# Patient Record
Sex: Male | Born: 1995 | Race: Black or African American | Hispanic: No | Marital: Single | Smoking: Never smoker
Health system: Southern US, Community
[De-identification: ages and names within clinical notes are randomized; demographics above are authoritative.]

## PROBLEM LIST (undated history)

## (undated) DIAGNOSIS — Z789 Other specified health status: Secondary | ICD-10-CM

---
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
## 2015-05-05 ENCOUNTER — Emergency Department (HOSPITAL_COMMUNITY): Payer: BLUE CROSS/BLUE SHIELD

## 2015-05-05 ENCOUNTER — Encounter (HOSPITAL_COMMUNITY): Payer: Self-pay | Admitting: Emergency Medicine

## 2015-05-05 ENCOUNTER — Emergency Department (HOSPITAL_COMMUNITY)
Admission: EM | Admit: 2015-05-05 | Discharge: 2015-05-05 | Disposition: A | Discharge status: Home / Self Care | Payer: BLUE CROSS/BLUE SHIELD | Attending: Emergency Medicine | Admitting: Emergency Medicine

## 2015-05-05 DIAGNOSIS — Z87828 Personal history of other (healed) physical injury and trauma: Secondary | ICD-10-CM | POA: Diagnosis not present

## 2015-05-05 DIAGNOSIS — M79641 Pain in right hand: Secondary | ICD-10-CM | POA: Diagnosis present

## 2015-05-05 DIAGNOSIS — Z720 Tobacco use: Secondary | ICD-10-CM | POA: Diagnosis not present

## 2015-05-05 NOTE — ED Notes (Signed)
AVS explained in detail. Work note given. Follow up for orthopedics given. No other c/c. Full ROM noted with hand.

## 2015-05-05 NOTE — ED Provider Notes (Signed)
CSN: [id number]     Arrival date & time 05/05/15  1315 History  This chart was scribed for non-physician practitioner Santiago Glad, PA-C working with Doug Sou, MD by Littie Deeds, ED Scribe. This patient was seen in room WTR8/WTR8 and the patient's care was started at 2:11 PM.      Chief Complaint  Patient presents with  . Hand Injury   The history is provided by the patient. No language interpreter was used.   HPI Comments: Brandon Cortez is a 19 y.o. male who presents to the Emergency Department complaining of sudden onset right hand pain and swelling to his 3rd knuckle secondary to an injury while playing basketball 1 month ago. His hand was hit by another player, but he is unsure of the exact mechanism of injury. The pain did improve, but started hurting again when he played basketball last week. He has not tried anything for the pain. Patient denies numbness and tingling to the hand. He has not been previously seen for the injury as he thought his hand would heal on its own.   History reviewed. No pertinent past medical history. History reviewed. No pertinent past surgical history. History reviewed. No pertinent family history. History  Substance Use Topics  . Smoking status: Current Some Day Smoker  . Smokeless tobacco: Not on file  . Alcohol Use: No    Review of Systems  Musculoskeletal: Positive for joint swelling and arthralgias.  Neurological: Negative for numbness.      Allergies  Review of patient's allergies indicates no known allergies.  Home Medications   Prior to Admission medications   Not on File   BP 119/75 mmHg  Pulse 52  Temp(Src) 97.5 F (36.4 C) (Oral)  Resp 16  SpO2 100% Physical Exam  Constitutional: He is oriented to person, place, and time. He appears well-developed and well-nourished. No distress.  HENT:  Head: Normocephalic and atraumatic.  Mouth/Throat: Oropharynx is clear and moist. No oropharyngeal exudate.  Eyes: Pupils are  equal, round, and reactive to light.  Neck: Neck supple.  Cardiovascular: Normal rate.   Pulses:      Radial pulses are 2+ on the right side.  Pulmonary/Chest: Effort normal.  Musculoskeletal: He exhibits tenderness. He exhibits no edema.  TTP to the 3rd MCP and 3rd metacarpal. No TTP of the remainder of hand. Full ROM to all fingers of the hand.   Neurological: He is alert and oriented to person, place, and time. No cranial nerve deficit.  Distal sensation of right hand intact.  Skin: Skin is warm and dry. No rash noted.  Psychiatric: He has a normal mood and affect. His behavior is normal.  Nursing note and vitals reviewed.   ED Course  Procedures  DIAGNOSTIC STUDIES: Oxygen Saturation is 100% on room air, normal by my interpretation.    COORDINATION OF CARE: 2:15 PM-Discussed treatment plan which includes XR imaging with patient/guardian at bedside and patient/guardian agreed to plan.    Labs Review Labs Reviewed - No data to display  Imaging Review Dg Hand Complete Right  05/05/2015   CLINICAL DATA:  Right hand injury playing basketball 1 month ago. Persistent pain in region of third MCP joint. Initial encounter.  EXAM: RIGHT HAND - COMPLETE 3+ VIEW  COMPARISON:  None.  FINDINGS: There is no evidence of fracture or dislocation. There is no evidence of arthropathy or other focal bone abnormality. Soft tissues are unremarkable.  IMPRESSION: Negative.   Electronically Signed   By: Jonny Ruiz  Eppie Gibson M.D.   On: 05/05/2015 14:51     EKG Interpretation None      MDM   Final diagnoses:  None   Patient presents with intermittent right hand pain that has been present since an injury playing basketball one month ago.  Xray is negative.  Patient neurovascularly intact.  Full ROM of the hand.  Feel that the patient is stable for discharge.  Return precautions given.     Santiago Glad, PA-C 05/05/15 1531  Doug Sou, MD 05/05/15 [phone]

## 2015-05-05 NOTE — ED Notes (Signed)
Pt reports hand injury x1 month ago (right hand). Second knuckle noted to be swollen. Unsure how he injured right hand. No other c/c.

## 2015-05-21 ENCOUNTER — Emergency Department (HOSPITAL_COMMUNITY)
Admission: EM | Admit: 2015-05-21 | Discharge: 2015-05-21 | Discharge status: Against Medical Advice | Payer: BLUE CROSS/BLUE SHIELD | Attending: Emergency Medicine | Admitting: Emergency Medicine

## 2015-05-21 ENCOUNTER — Encounter (HOSPITAL_COMMUNITY): Payer: Self-pay | Admitting: Emergency Medicine

## 2015-05-21 ENCOUNTER — Emergency Department (HOSPITAL_COMMUNITY): Payer: BLUE CROSS/BLUE SHIELD

## 2015-05-21 DIAGNOSIS — R079 Chest pain, unspecified: Secondary | ICD-10-CM | POA: Insufficient documentation

## 2015-05-21 DIAGNOSIS — Z720 Tobacco use: Secondary | ICD-10-CM | POA: Diagnosis not present

## 2015-05-21 NOTE — ED Notes (Signed)
Pt called to take back to treatment room. No answer from lobby.

## 2015-05-21 NOTE — ED Notes (Signed)
Pt called 3x for blood work. Even went outside and called for pt.

## 2015-05-21 NOTE — ED Notes (Signed)
Called pt again and no anwer

## 2015-05-21 NOTE — ED Notes (Signed)
Pt c/o left sided chest pain that started yesterday when he was playing basketball. Pt also c/o being congested and getting worse.  Pt also states that he was seen here couple weeks ago for hand injury while playing basketball and was told to come back if pain worsens.

## 2015-10-06 ENCOUNTER — Emergency Department (HOSPITAL_COMMUNITY)
Admission: EM | Admit: 2015-10-06 | Discharge: 2015-10-06 | Disposition: A | Discharge status: Home / Self Care | Payer: BLUE CROSS/BLUE SHIELD | Attending: Emergency Medicine | Admitting: Emergency Medicine

## 2015-10-06 ENCOUNTER — Encounter (HOSPITAL_COMMUNITY): Payer: Self-pay | Admitting: Emergency Medicine

## 2015-10-06 DIAGNOSIS — F410 Panic disorder [episodic paroxysmal anxiety] without agoraphobia: Secondary | ICD-10-CM | POA: Insufficient documentation

## 2015-10-06 DIAGNOSIS — F17200 Nicotine dependence, unspecified, uncomplicated: Secondary | ICD-10-CM | POA: Diagnosis not present

## 2015-10-06 DIAGNOSIS — F419 Anxiety disorder, unspecified: Secondary | ICD-10-CM | POA: Diagnosis present

## 2015-10-06 NOTE — ED Provider Notes (Signed)
CSN: [id number]     Arrival date & time 10/06/15  1529 History   First MD Initiated Contact with Patient 10/06/15 1538     Chief Complaint  Patient presents with  . Panic Attack     (Consider location/radiation/quality/duration/timing/severity/associated sxs/prior Treatment) HPI Comments: Pt became upset w/ girlfriend after having an argument. Admits to etoh use today. Denies drug use H/o panic attacks and this was similar. Endorsed hyperventilation initially but was able to calm down Denies si/hi.called ems and transported here. No treatment used pta and states that he feels at his baseline. Denies med use or prior tx for his anxiety  The history is provided by the patient.    History reviewed. No pertinent past medical history. History reviewed. No pertinent past surgical history. History reviewed. No pertinent family history. Social History  Substance Use Topics  . Smoking status: Current Some Day Smoker  . Smokeless tobacco: None  . Alcohol Use: No    Review of Systems  All other systems reviewed and are negative.     Allergies  Review of patient's allergies indicates no known allergies.  Home Medications   Prior to Admission medications   Not on File   SpO2 99% Physical Exam  Constitutional: He is oriented to person, place, and time. He appears well-developed and well-nourished.  Non-toxic appearance. No distress.  HENT:  Head: Normocephalic and atraumatic.  Eyes: Conjunctivae, EOM and lids are normal. Pupils are equal, round, and reactive to light.  Neck: Normal range of motion. Neck supple. No tracheal deviation present. No thyroid mass present.  Cardiovascular: Normal rate, regular rhythm and normal heart sounds.  Exam reveals no gallop.   No murmur heard. Pulmonary/Chest: Effort normal and breath sounds normal. No stridor. No respiratory distress. He has no decreased breath sounds. He has no wheezes. He has no rhonchi. He has no rales.  Abdominal: Soft.  Normal appearance and bowel sounds are normal. He exhibits no distension. There is no tenderness. There is no rebound and no CVA tenderness.  Musculoskeletal: Normal range of motion. He exhibits no edema or tenderness.  Neurological: He is alert and oriented to person, place, and time. He has normal strength. No cranial nerve deficit or sensory deficit. GCS eye subscore is 4. GCS verbal subscore is 5. GCS motor subscore is 6.  Skin: Skin is warm and dry. No abrasion and no rash noted.  Psychiatric: His speech is normal and behavior is normal. His affect is blunt.  Nursing note and vitals reviewed.   ED Course  Procedures (including critical care time) Labs Review Labs Reviewed - No data to display  Imaging Review No results found. I have personally reviewed and evaluated these images and lab results as part of my medical decision-making.   EKG Interpretation None      MDM   Final diagnoses:  None    Pt at his baseline now  No acute psychiatric emergency  Will give referral to [facility]    Jerrico Covello, MD 10/06/15 1551

## 2015-10-06 NOTE — ED Notes (Signed)
Per EMS, was on phone with girlfriend when they got into an argument. Pt began hyperventilating and had to lay down. Hx of anxiety in the past, stated it felt like one of his past panic attacks. Denies pain, stated he felt better after the ride over here.

## 2015-10-06 NOTE — Discharge Instructions (Signed)
Panic Attacks °Panic attacks are sudden, short-lived surges of severe anxiety, fear, or discomfort. They may occur for no reason when you are relaxed, when you are anxious, or when you are sleeping. Panic attacks may occur for a number of reasons:  °· Healthy people occasionally have panic attacks in extreme, life-threatening situations, such as war or natural disasters. Normal anxiety is a protective mechanism of the body that helps us react to danger (fight or flight response). °· Panic attacks are often seen with anxiety disorders, such as panic disorder, social anxiety disorder, generalized anxiety disorder, and phobias. Anxiety disorders cause excessive or uncontrollable anxiety. They may interfere with your relationships or other life activities. °· Panic attacks are sometimes seen with other mental illnesses, such as depression and posttraumatic stress disorder. °· Certain medical conditions, prescription medicines, and drugs of abuse can cause panic attacks. °SYMPTOMS  °Panic attacks start suddenly, peak within 20 minutes, and are accompanied by four or more of the following symptoms: °· Pounding heart or fast heart rate (palpitations). °· Sweating. °· Trembling or shaking. °· Shortness of breath or feeling smothered. °· Feeling choked. °· Chest pain or discomfort. °· Nausea or strange feeling in your stomach. °· Dizziness, light-headedness, or feeling like you will faint. °· Chills or hot flushes. °· Numbness or tingling in your lips or hands and feet. °· Feeling that things are not real or feeling that you are not yourself. °· Fear of losing control or going crazy. °· Fear of dying. °Some of these symptoms can mimic serious medical conditions. For example, you may think you are having a heart attack. Although panic attacks can be very scary, they are not life threatening. °DIAGNOSIS  °Panic attacks are diagnosed through an assessment by your health care provider. Your health care provider will ask  questions about your symptoms, such as where and when they occurred. Your health care provider will also ask about your medical history and use of alcohol and drugs, including prescription medicines. Your health care provider may order blood tests or other studies to rule out a serious medical condition. Your health care provider may refer you to a mental health professional for further evaluation. °TREATMENT  °· Most healthy people who have one or two panic attacks in an extreme, life-threatening situation will not require treatment. °· The treatment for panic attacks associated with anxiety disorders or other mental illness typically involves counseling with a mental health professional, medicine, or a combination of both. Your health care provider will help determine what treatment is best for you. °· Panic attacks due to physical illness usually go away with treatment of the illness. If prescription medicine is causing panic attacks, talk with your health care provider about stopping the medicine, decreasing the dose, or substituting another medicine. °· Panic attacks due to alcohol or drug abuse go away with abstinence. Some adults need professional help in order to stop drinking or using drugs. °HOME CARE INSTRUCTIONS  °· Take all medicines as directed by your health care provider.   °· Schedule and attend follow-up visits as directed by your health care provider. It is important to keep all your appointments. °SEEK MEDICAL CARE IF: °· You are not able to take your medicines as prescribed. °· Your symptoms do not improve or get worse. °SEEK IMMEDIATE MEDICAL CARE IF:  °· You experience panic attack symptoms that are different than your usual symptoms. °· You have serious thoughts about hurting yourself or others. °· You are taking medicine for panic attacks and   have a serious side effect. MAKE SURE YOU:  Understand these instructions.  Will watch your condition.  Will get help right away if you are not  doing well or get worse.   This information is not intended to replace advice given to you by your health care provider. Make sure you discuss any questions you have with your health care provider.   Document Released: 09/18/2005 Document Revised: 09/23/2013 Document Reviewed: 05/02/2013 Elsevier Interactive Patient Education 2016 [employer] Substance Abuse Treatment Programs  Intensive Outpatient Programs [facility]     [address]      [place], [place]                   [phone]       [facility] [address] [place], [place] [phone]  [facility]     (Inpatient and outpatient)     [address]           [phone]    [facility] [phone] (Suboxone and Methadone)  [address]      [place], [place] [address]      [phone]       [address] [place], [place] [phone]  [facility] (Outpatient/Inpatient, Chemical)    (insurance only) [phone]             [facility] (Groups & Residential) [place], [place] [phone]     [facility]     [address]     [place], [place]      [phone]       [facility] (for caregivers and family) [phone] [address] [place], [place] [phone]      Residential Treatment Programs [facility]      [address], [place], [place] [address]  [phone]       [facility] [address] [place], [place] [address] [phone]  [facility]        [phone]       [facility] [phone]  [facility] ([facility]             [address]                                         [place], [place]                                                [phone] or [phone]                               [facility] [place] [address] [place], [address] [phone]  [facility]    [address]      [place], [place]     [phone]       [facility] [address] [place], [place] [phone]  [facility]   [address]     [place], [place] [address]     [phone]      Admissions: 8am-3pm M-F  [facility] (RTS) [address] [place], [place] [phone]  [facility]ATS Program:  Residential Program ([address]90 Days)   [place], [place]      [phone] or [phone]     ADATC: [facility] [place], [place] (Walk in Hours over the weekend or by referral)  [facility] [address] [place], [place], [place] [address] [phone]  Crisis Mobile: [facility]:  [phone] (for crisis response 24 hours a day) [facility] Hotline:      [phone] Outpatient Psychiatry and Counseling  [facility]: Mobile Crisis Management 24 hours:  [phone]  [facility] sliding scale fee and walk in schedule: M-F 8am-12pm/1pm-3pm [address]  [place], [place] [address] [phone]  [facility] [address] [place], [place] [address] [phone]  [facility] (Formerly known as [facility])- new patient walk-in appointments available Monday - Friday 8am -3pm.          [address] [place], [place] [address] [phone] or crisis line- [phone]  [facility] Services/ Intensive Outpatient Therapy Program [address] [place], [place] [address] [phone]  [facility]                  Crisis Services      [phone]      [address]     [place], [place] [address]                 [place] [facility]   [facility] [phone] [address] [place], [place] [address]   [facility]          [address] Laura,  [place], [place] [address]       [phone]  [facility] [address] [place], [place] [address] [phone]  [facility]    [address]    [place], [place] [address]     [phone]       Andee PolesParish McKinney, MD     [address]     [place] [address]     [phone]       [facility] [address] [place] [address]  [facility]     [address]     [place], [place]      [phone]       [facility] Eulogio DitchShamsher Ahluwalia, MD [address] [place], [place] [phone]  [facility]     [address]     [place], [place] [address]     [phone]       [facility] [address] [place], [place] [address] [phone] Resources for Temporary Residential Assistance/Crisis Centers  [facility](IRC) M-F 8am-3pm   [address] [place], [place] [address]   [phone] Services include: laundry, barbering, support groups, case management, phone  & computer access, showers, AA/NA mtgs, mental health/substance abuse nurse, job skills class, disability information, VA assistance, spiritual classes, etc.   HOMELESS SHELTERS  [facility]     [facility]   [address], [place]     [phone]              [facility] (women and children)       [address] [place], [place] [address] [phone] [email or web address] for application and process Application Required  [facility]  [address]    [place] [address]     [phone]                    [facility] [address] [place], [place] [address] [phone](schedule application appt.) Application Required  [facility] (women only)    [address]     [place], [place] [address]     [phone]      Intake starts 6pm daily Need valid ID, SSC, & Police  report [facility] [place] [address] [place], [place] [phone] Application Required  [facility] (men only)     [address]      [place], [place]     [phone]       [facility] (Pregnant women only) [address] [place], [place] [phone]  [facility]      [address]      [place], [place] [address]     [phone]             [facility] [address] [place], [place] [phone] 90 day commitment/SA/Application process  [facility](men only)     [address]     [place], [place]     [phone]       Check-in at [facility]7pm            Crisis [facility] [address] [place], [place] [address] [phone] Men/Women/Women and Children must be there by 7 pm  [facility] [place], [place] [phone]

## 2015-12-14 ENCOUNTER — Emergency Department (HOSPITAL_COMMUNITY)
Admission: EM | Admit: 2015-12-14 | Discharge: 2015-12-14 | Disposition: A | Discharge status: Home / Self Care | Payer: BLUE CROSS/BLUE SHIELD | Attending: Emergency Medicine | Admitting: Emergency Medicine

## 2015-12-14 ENCOUNTER — Encounter (HOSPITAL_COMMUNITY): Payer: Self-pay | Admitting: *Deleted

## 2015-12-14 DIAGNOSIS — R109 Unspecified abdominal pain: Secondary | ICD-10-CM | POA: Diagnosis present

## 2015-12-14 DIAGNOSIS — R1110 Vomiting, unspecified: Secondary | ICD-10-CM | POA: Diagnosis not present

## 2015-12-14 DIAGNOSIS — F17200 Nicotine dependence, unspecified, uncomplicated: Secondary | ICD-10-CM | POA: Diagnosis not present

## 2015-12-14 LAB — COMPREHENSIVE METABOLIC PANEL
ALBUMIN: 4.6 g/dL (ref 3.5–5.0)
ALK PHOS: 88 U/L (ref 38–126)
ALT: 18 U/L (ref 17–63)
AST: 24 U/L (ref 15–41)
Anion gap: 8 (ref 5–15)
BILIRUBIN TOTAL: 1.1 mg/dL (ref 0.3–1.2)
BUN: 16 mg/dL (ref 6–20)
CALCIUM: 10 mg/dL (ref 8.9–10.3)
CO2: 27 mmol/L (ref 22–32)
Chloride: 105 mmol/L (ref 101–111)
Creatinine, Ser: 1.07 mg/dL (ref 0.61–1.24)
GFR calc Af Amer: >60 mL/min (ref >60)
GFR calc non Af Amer: >60 mL/min (ref >60)
GLUCOSE: 100 mg/dL — AB (ref 65–99)
POTASSIUM: 4.2 mmol/L (ref 3.5–5.1)
Sodium: 140 mmol/L (ref 135–145)
TOTAL PROTEIN: 8.2 g/dL — AB (ref 6.5–8.1)

## 2015-12-14 LAB — URINALYSIS, ROUTINE W REFLEX MICROSCOPIC
BILIRUBIN URINE: NEGATIVE
Glucose, UA: NEGATIVE mg/dL
HGB URINE DIPSTICK: NEGATIVE
KETONES UR: NEGATIVE mg/dL
Leukocytes, UA: NEGATIVE
NITRITE: NEGATIVE
PH: 6 (ref 5.0–8.0)
Protein, ur: NEGATIVE mg/dL
Specific Gravity, Urine: 1.026 (ref 1.005–1.030)

## 2015-12-14 LAB — CBC
HEMATOCRIT: 48 % (ref 39.0–52.0)
Hemoglobin: 16.8 g/dL (ref 13.0–17.0)
MCH: 28.7 pg (ref 26.0–34.0)
MCHC: 35 g/dL (ref 30.0–36.0)
MCV: 81.9 fL (ref 78.0–100.0)
Platelets: 148 10*3/uL — ABNORMAL LOW (ref 150–400)
RBC: 5.86 MIL/uL — ABNORMAL HIGH (ref 4.22–5.81)
RDW: 12.9 % (ref 11.5–15.5)
WBC: 3 10*3/uL — ABNORMAL LOW (ref 4.0–10.5)

## 2015-12-14 LAB — LIPASE, BLOOD: Lipase: 21 U/L (ref 11–51)

## 2015-12-14 NOTE — ED Notes (Signed)
Pt states vomiting times 3 days and abdominal pain.  Denies diarrhea but thinks fever

## 2015-12-14 NOTE — ED Notes (Signed)
Call no answer times 3

## 2017-11-17 ENCOUNTER — Encounter (HOSPITAL_COMMUNITY): Payer: Self-pay | Admitting: Emergency Medicine

## 2017-11-17 ENCOUNTER — Ambulatory Visit (HOSPITAL_COMMUNITY)
Admission: EM | Admit: 2017-11-17 | Discharge: 2017-11-17 | Disposition: A | Discharge status: Home / Self Care | Payer: BLUE CROSS/BLUE SHIELD | Attending: Family Medicine | Admitting: Family Medicine

## 2017-11-17 DIAGNOSIS — R112 Nausea with vomiting, unspecified: Secondary | ICD-10-CM | POA: Diagnosis not present

## 2017-11-17 DIAGNOSIS — R197 Diarrhea, unspecified: Secondary | ICD-10-CM | POA: Diagnosis not present

## 2017-11-17 MED ORDER — ONDANSETRON 4 MG PO TBDP: 4.0000 mg | ORAL_TABLET | Freq: Three times a day (TID) | ORAL | 0 refills | Status: DC | PRN

## 2017-11-17 NOTE — ED Triage Notes (Signed)
PT C/O: abd pain onset last night associated w/nauseas, vomiting, diarrhea.... Reports it may have been to some tacos he ate  A&O x4... NAD... Ambulatory

## 2017-11-17 NOTE — ED Provider Notes (Signed)
[facility]    CSN: [id number] Arrival date & time: 11/17/17  1745     History   Chief Complaint Chief Complaint  Patient presents with  . Abdominal Pain    HPI Brandon Cortez is a 22 y.o. male.   22 year old male comes in for a 1 day history of abdominal pain, nausea, vomiting, diarrhea. States symptoms happened after he had some tacos, but he has also been in contact with family members who have similar symptoms. States abdominal pain is generalized, described as "feels like I need a detox". Nonbilious nonbloody vomit, which improves the abdominal pain. He has had some diarrhea, which he thinks is resolving, as last episode was in the morning. Denies blood in stool. Denies fever, chills, night sweats. Denies recent travels, antibiotic use. Endorses THC use, though states he does not get nauseated usually with it, last used yesterday morning. Denies other drug use.       History reviewed. No pertinent past medical history.  There are no active problems to display for this patient.   History reviewed. No pertinent surgical history.     Home Medications    Prior to Admission medications   Medication Sig Start Date End Date Taking? Authorizing Provider  ondansetron (ZOFRAN ODT) 4 MG disintegrating tablet Take 1 tablet (4 mg total) by mouth every 8 (eight) hours as needed for nausea or vomiting. 11/17/17   Belinda FisherYu, Anna Beaird V, PA-C    Family History History reviewed. No pertinent family history.  Social History Social History   Tobacco Use  . Smoking status: Current Some Day Smoker  . Smokeless tobacco: Never Used  Substance Use Topics  . Alcohol use: No  . Drug use: Yes    Types: Marijuana     Allergies   Patient has no known allergies.   Review of Systems Review of Systems  Reason unable to perform ROS: See HPI as above.     Physical Exam Triage Vital Signs ED Triage Vitals [11/17/17 1820]  Enc Vitals Group     BP 127/84     Pulse Rate (!) 49     Resp 20     Temp 98.8 F (37.1 C)     Temp Source Oral     SpO2 96 %     Weight      Height      Head Circumference      Peak Flow      Pain Score      Pain Loc      Pain Edu?      Excl. in GC?    No data found.  Updated Vital Signs BP 127/84 (BP Location: Left Arm)   Pulse (!) 49   Temp 98.8 F (37.1 C) (Oral)   Resp 20   SpO2 96%   Physical Exam  Constitutional: He is oriented to person, place, and time. He appears well-developed and well-nourished. No distress.  HENT:  Head: Normocephalic and atraumatic.  Cardiovascular: Regular rhythm and normal heart sounds. Bradycardia present. Exam reveals no gallop and no friction rub.  No murmur heard. Pulmonary/Chest: Effort normal and breath sounds normal. He has no wheezes. He has no rales.  Abdominal: Soft. Bowel sounds are normal. He exhibits no mass. There is no tenderness. There is no rebound and no guarding.  Neurological: He is alert and oriented to person, place, and time.  Skin: Skin is warm and dry.  Psychiatric: He has a normal mood and affect. His behavior is  normal. Judgment normal.    UC Treatments / Results  Labs (all labs ordered are listed, but only abnormal results are displayed) Labs Reviewed - No data to display  EKG  EKG Interpretation None       Radiology No results found.  Procedures Procedures (including critical care time)  Medications Ordered in UC Medications - No data to display   Initial Impression / Assessment and Plan / UC Course  I have reviewed the triage vital signs and the nursing notes.  Pertinent labs & imaging results that were available during my care of the patient were reviewed by me and considered in my medical decision making (see chart for details).    Patient with bradycardia on exam, 54 bpm, without shortness of breath, weakness, dizziness, syncope. 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 Discussed with patient no alarming signs on exam. Zofran  for nausea. Push fluids. Bland diet, advance as tolerated. Return precautions given. Patient expresses understanding and agrees to plan.   Final Clinical Impressions(s) / UC Diagnoses   Final diagnoses:  Nausea vomiting and diarrhea    ED Discharge Orders        Ordered    ondansetron (ZOFRAN ODT) 4 MG disintegrating tablet  Every 8 hours PRN     11/17/17 1833        Belinda Fisher, PA-C 11/17/17 1839

## 2017-11-17 NOTE — Discharge Instructions (Signed)
Zofran for nausea and vomiting as needed. Keep hydrated, you urine should be clear to pale yellow in color. Bland diet as attached, advance as tolerated. Probiotics after diarrhea resolves. Monitor for any worsening of symptoms, nausea or vomiting not controlled by medication, worsening abdominal pain, fever, shortness of breath, weakness, passing out, go to the emergency department for further evaluation.

## 2018-05-02 ENCOUNTER — Emergency Department (HOSPITAL_COMMUNITY)
Admission: EM | Admit: 2018-05-02 | Discharge: 2018-05-02 | Disposition: A | Discharge status: Other Acute Inpatient Hospital | Payer: BLUE CROSS/BLUE SHIELD | Attending: Emergency Medicine | Admitting: Emergency Medicine

## 2018-05-02 ENCOUNTER — Encounter (HOSPITAL_COMMUNITY): Payer: Self-pay | Admitting: *Deleted

## 2018-05-02 ENCOUNTER — Encounter (HOSPITAL_COMMUNITY): Payer: Self-pay

## 2018-05-02 ENCOUNTER — Inpatient Hospital Stay (HOSPITAL_COMMUNITY)
Admission: AD | Admit: 2018-05-02 | Discharge: 2018-05-06 | DRG: 885 | Disposition: A | Discharge status: Home / Self Care | Payer: BLUE CROSS/BLUE SHIELD | Source: Intra-hospital | Attending: Psychiatry | Admitting: Psychiatry

## 2018-05-02 DIAGNOSIS — F322 Major depressive disorder, single episode, severe without psychotic features: Secondary | ICD-10-CM | POA: Diagnosis present

## 2018-05-02 DIAGNOSIS — R45851 Suicidal ideations: Secondary | ICD-10-CM | POA: Diagnosis present

## 2018-05-02 DIAGNOSIS — Z818 Family history of other mental and behavioral disorders: Secondary | ICD-10-CM | POA: Diagnosis not present

## 2018-05-02 DIAGNOSIS — F410 Panic disorder [episodic paroxysmal anxiety] without agoraphobia: Secondary | ICD-10-CM | POA: Diagnosis present

## 2018-05-02 DIAGNOSIS — F17200 Nicotine dependence, unspecified, uncomplicated: Secondary | ICD-10-CM | POA: Diagnosis not present

## 2018-05-02 DIAGNOSIS — F329 Major depressive disorder, single episode, unspecified: Secondary | ICD-10-CM | POA: Diagnosis present

## 2018-05-02 DIAGNOSIS — F32A Depression, unspecified: Secondary | ICD-10-CM

## 2018-05-02 DIAGNOSIS — G4700 Insomnia, unspecified: Secondary | ICD-10-CM | POA: Diagnosis present

## 2018-05-02 DIAGNOSIS — F1210 Cannabis abuse, uncomplicated: Secondary | ICD-10-CM | POA: Diagnosis not present

## 2018-05-02 DIAGNOSIS — F411 Generalized anxiety disorder: Secondary | ICD-10-CM | POA: Diagnosis present

## 2018-05-02 HISTORY — DX: Other specified health status: Z78.9

## 2018-05-02 LAB — RAPID URINE DRUG SCREEN, HOSP PERFORMED
Amphetamines: NOT DETECTED
BARBITURATES: NOT DETECTED
BENZODIAZEPINES: NOT DETECTED
COCAINE: NOT DETECTED
OPIATES: NOT DETECTED
Tetrahydrocannabinol: POSITIVE — AB

## 2018-05-02 LAB — CBC WITH DIFFERENTIAL/PLATELET
BASOS ABS: 0 10*3/uL (ref 0.0–0.1)
Basophils Relative: 0 %
EOS PCT: 0 %
Eosinophils Absolute: 0 10*3/uL (ref 0.0–0.7)
HEMATOCRIT: 42.6 % (ref 39.0–52.0)
Hemoglobin: 14.9 g/dL (ref 13.0–17.0)
LYMPHS ABS: 2.1 10*3/uL (ref 0.7–4.0)
LYMPHS PCT: 47 %
MCH: 29 pg (ref 26.0–34.0)
MCHC: 35 g/dL (ref 30.0–36.0)
MCV: 82.9 fL (ref 78.0–100.0)
MONO ABS: 0.4 10*3/uL (ref 0.1–1.0)
Monocytes Relative: 9 %
Neutro Abs: 1.9 10*3/uL (ref 1.7–7.7)
Neutrophils Relative %: 44 %
PLATELETS: 176 10*3/uL (ref 150–400)
RBC: 5.14 MIL/uL (ref 4.22–5.81)
RDW: 12.7 % (ref 11.5–15.5)
WBC: 4.3 10*3/uL (ref 4.0–10.5)

## 2018-05-02 LAB — ETHANOL

## 2018-05-02 LAB — COMPREHENSIVE METABOLIC PANEL
ALBUMIN: 4.2 g/dL (ref 3.5–5.0)
ALT: 26 U/L (ref 0–44)
ANION GAP: 7 (ref 5–15)
AST: 33 U/L (ref 15–41)
Alkaline Phosphatase: 85 U/L (ref 38–126)
BUN: 18 mg/dL (ref 6–20)
CALCIUM: 9.2 mg/dL (ref 8.9–10.3)
CO2: 28 mmol/L (ref 22–32)
Chloride: 105 mmol/L (ref 98–111)
Creatinine, Ser: 1.11 mg/dL (ref 0.61–1.24)
GLUCOSE: 83 mg/dL (ref 70–99)
POTASSIUM: 3.9 mmol/L (ref 3.5–5.1)
Sodium: 140 mmol/L (ref 135–145)
TOTAL PROTEIN: 7.5 g/dL (ref 6.5–8.1)
Total Bilirubin: 1.1 mg/dL (ref 0.3–1.2)

## 2018-05-02 MED ORDER — HYDROXYZINE HCL 25 MG PO TABS
25.0000 mg | ORAL_TABLET | Freq: Three times a day (TID) | ORAL | Status: DC | PRN
  Administered 2018-05-02: 25 mg via ORAL
  Filled 2018-05-02: qty 1

## 2018-05-02 MED ORDER — ONDANSETRON HCL 4 MG PO TABS: 4.0000 mg | ORAL_TABLET | Freq: Three times a day (TID) | ORAL | Status: DC | PRN

## 2018-05-02 MED ORDER — ACETAMINOPHEN 325 MG PO TABS: 650.0000 mg | ORAL_TABLET | Freq: Four times a day (QID) | ORAL | Status: DC | PRN

## 2018-05-02 MED ORDER — TRAZODONE HCL 50 MG PO TABS
50.0000 mg | ORAL_TABLET | Freq: Every evening | ORAL | Status: DC | PRN
  Administered 2018-05-02 – 2018-05-05 (×4): 50 mg via ORAL
  Filled 2018-05-02 (×4): qty 1
  Filled 2018-05-02: qty 14

## 2018-05-02 MED ORDER — MAGNESIUM HYDROXIDE 400 MG/5ML PO SUSP: 30.0000 mL | Freq: Every day | ORAL | Status: DC | PRN

## 2018-05-02 MED ORDER — ACETAMINOPHEN 325 MG PO TABS: 650.0000 mg | ORAL_TABLET | ORAL | Status: DC | PRN

## 2018-05-02 MED ORDER — DIPHENHYDRAMINE HCL 25 MG PO CAPS: 25.0000 mg | ORAL_CAPSULE | Freq: Four times a day (QID) | ORAL | Status: DC | PRN

## 2018-05-02 MED ORDER — ALUM & MAG HYDROXIDE-SIMETH 200-200-20 MG/5ML PO SUSP: 30.0000 mL | ORAL | Status: DC | PRN
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
## 2018-05-02 NOTE — Tx Team (Signed)
Initial Treatment Plan 05/02/2018 7:24 PM Clester Barbaraann BarthelS Lysaght BJY:[id number]    PATIENT STRESSORS: Loss of relationship Other: Isolation   PATIENT STRENGTHS: Ability for insight Average or above average intelligence Communication skills General fund of knowledge Motivation for treatment/growth Supportive family/friends   PATIENT IDENTIFIED PROBLEMS: Suicide risk  Coping skills for depression  "Need help with relationship (w girlfriend"                 DISCHARGE CRITERIA:  Adequate post-discharge living arrangements Improved stabilization in mood, thinking, and/or behavior Need for constant or close observation no longer present Reduction of life-threatening or endangering symptoms to within safe limits  PRELIMINARY DISCHARGE PLAN: Return to previous living arrangement  PATIENT/FAMILY INVOLVEMENT: This treatment plan has been presented to and reviewed with the patient, Brandon Cortez.  The patient and family have been given the opportunity to ask questions and make suggestions.  Karren BurlyMain, Seyon Strader Katherine, RN 05/02/2018, 7:24 PM

## 2018-05-02 NOTE — BH Assessment (Signed)
Tele Assessment Note   Patient Name: Brandon Cortez MRN: [id number] Referring Physician:  Margarita Grizzle, MD Location of [facility]:  [facility]  Location of Provider: Behavioral Health TTS Department  Brandon Cortez is an 22 y.o. male ptient presented to the hospital via ambulance with complaints of depression and suicidal ideations. Patient found in his house in the bath tub with water up to his neck but denies he was attempting to hurt himself. Patient report earlier he sent a text to his mother stating, "Life sends to much pain I do not want it." Report depression triggered by relationship stressors. Report he has not really seen his girlfriend and 18-year-old son in a couple weeks and does not understand why. Report they live together and she has not been coming home. Report history of anxiety attacks, denies history of suicidal intent, denies history of inpatient or outpatient care. Denies homicidal ideations, denies auditory / visual hallucinations.   Patient present with a sad affect, responding to internal and external stimuli. Patient dressed in shrubs. Patient judgement impaired due to depressed affect with suicidal ideations.   Collateral - Lilyan Punt, mother  Mom report son text her expressing he 'cannot take it anymore and he does not want to be here.'  Mom sent for a well-check to the house due to she lives in [place]. Report she has history of depression and anxiety and the words he was expressing was triggers for her that her son needed help. During well-check police found patient in the tub slub over.   Diagnosis:  F32.2   Major depressive disorder, Single episode, Severe  Shuvon Rankin, NP, recommends patient meet inpatient hospitalization   Past Medical History: History reviewed. No pertinent past medical history.  History reviewed. No pertinent surgical history.  Family History: No family history on file.  Social History:  reports that he has been smoking.  He has never used  smokeless tobacco. He reports that he has current or past drug history. Drug: Marijuana. He reports that he does not drink alcohol.  Additional Social History:  Alcohol / Drug Use Pain Medications: see MAR Prescriptions: see MAR Over the Counter: see MAR History of alcohol / drug use?: Yes Longest period of sobriety (when/how long): n/a Substance #1 Name of Substance 1: THC  1 - Age of First Use: 9th grade 1 - Amount (size/oz): unknown  1 - Frequency: often  1 - Duration: ongoing 1 - Last Use / Amount: week ago  CIWA: CIWA-Ar BP: 112/60 Pulse Rate: 72 COWS:    Allergies: No Known Allergies  Home Medications:  (Not in a hospital admission)  OB/GYN Status:  No LMP for male patient.  General Assessment Data Location of Assessment: [facility] TTS Assessment: In system Is this a Tele or Face-to-Face Assessment?: Tele Assessment Is this an Initial Assessment or a Re-assessment for this encounter?: Initial Assessment Marital status: Single Maiden name: n/a Living Arrangements: Other (Comment)(lives with girlfriend and 32-year-old) Can pt return to current living arrangement?: Yes Admission Status: Voluntary Is patient capable of signing voluntary admission?: Yes Referral Source: Self/Family/Friend Insurance type: BCBS     Crisis Care Plan Living Arrangements: Other (Comment)(lives with girlfriend and 49-year-old) Legal Guardian: Other:(self ) Name of Psychiatrist: pt denies Name of Therapist: pt denies   Education Status Is patient currently in school?: No Is the patient employed, unemployed or receiving disability?: Employed  Risk to self with the past 6 months Has patient had any suicidal plan within the past 6 months  prior to admission? : No Access to Means: No What has been your use of drugs/alcohol within the last 12 months?: THC  Previous Attempts/Gestures: No How many times?: 0 Other Self Harm Risks: none known  Triggers for Past Attempts: None known Intentional  Self Injurious Behavior: None Family Suicide History: No Recent stressful life event(s): Other (Comment)(relationship complications ) Persecutory voices/beliefs?: No Depression: Yes Depression Symptoms: Insomnia, Feeling worthless/self pity(sad, hopelessness) Substance abuse history and/or treatment for substance abuse?: No Suicide prevention information given to non-admitted patients: Not applicable  Risk to Others within the past 6 months Homicidal Ideation: No Does patient have any lifetime risk of violence toward others beyond the six months prior to admission? : No Thoughts of Harm to Others: No Current Homicidal Intent: No Current Homicidal Plan: No Access to Homicidal Means: No Identified Victim: n/a History of harm to others?: No Assessment of Violence: None Noted Violent Behavior Description: none noted Does patient have access to weapons?: No Criminal Charges Pending?: No Does patient have a court date: No Is patient on probation?: No  Psychosis Hallucinations: None noted Delusions: None noted  Mental Status Report Appearance/Hygiene: In scrubs Eye Contact: Good Motor Activity: Freedom of movement Speech: Logical/coherent Level of Consciousness: Alert Mood: Sad, Depressed Affect: Sad, Depressed Anxiety Level: None Thought Processes: Coherent, Relevant Judgement: Impaired(sad, depressed and suicidal ) Orientation: Person, Place, Time, Situation Obsessive Compulsive Thoughts/Behaviors: None  Cognitive Functioning Concentration: Normal Memory: Recent Intact, Remote Intact Is patient IDD: No Is patient DD?: No Insight: Good Impulse Control: Fair Appetite: Poor Have you had any weight changes? : No Change Sleep: Decreased(haven't sleep in 2days due to been worried) Vegetative Symptoms: None  ADLScreening [facility] Assessment Services) Patient's cognitive ability adequate to safely complete daily activities?: Yes Patient able to express need for assistance with  ADLs?: Yes Independently performs ADLs?: Yes (appropriate for developmental age)  Prior Inpatient Therapy Prior Inpatient Therapy: No  Prior Outpatient Therapy Prior Outpatient Therapy: No Does patient have an ACCT team?: No Does patient have Intensive In-House Services?  : No Does patient have [facility] services? : No Does patient have P4CC services?: No  ADL Screening (condition at time of admission) Patient's cognitive ability adequate to safely complete daily activities?: Yes Is the patient deaf or have difficulty hearing?: No Does the patient have difficulty seeing, even when wearing glasses/contacts?: No Does the patient have difficulty concentrating, remembering, or making decisions?: No Patient able to express need for assistance with ADLs?: Yes Does the patient have difficulty dressing or bathing?: No Independently performs ADLs?: Yes (appropriate for developmental age) Does the patient have difficulty walking or climbing stairs?: No       Abuse/Neglect Assessment (Assessment to be complete while patient is alone) Abuse/Neglect Assessment Can Be Completed: Yes Physical Abuse: Denies Verbal Abuse: Denies Sexual Abuse: Denies Exploitation of patient/patient's resources: Denies Self-Neglect: Denies     Merchant navy officerAdvance Directives (For Healthcare) Does Patient Have a Medical Advance Directive?: No Would patient like information on creating a medical advance directive?: No - Patient declined          Disposition:       Laurens Matheny [facility] 05/02/2018 3:46 PM

## 2018-05-02 NOTE — Progress Notes (Signed)
[facility] Shuvon Rankin, NP is the accepting provider. Dr. Jola Babinskilary is the attending provider.   Call report to [phone]  [facility].    Pt is voluntary and can be transported by [facility].  [facility].   Wells GuilesSarah Salimata Christenson, LCSW, LCAS Disposition CSW [facility] [phone]

## 2018-05-02 NOTE — ED Notes (Signed)
[facility] called for transport to [facility].  

## 2018-05-02 NOTE — ED Notes (Signed)
TTS in process 

## 2018-05-02 NOTE — ED Notes (Signed)
Pt notified that he needs to provide a urine sample. ekg collected and given to dr ray. Sitter at bedside

## 2018-05-02 NOTE — B[facility] Counselor (Addendum)
[facility] Accepting provider:  Assunta FoundShuvon Rankin, NP  Attending: Jola Babinskilary, MD   Patient is ready for arrival

## 2018-05-02 NOTE — ED Notes (Signed)
Advised patient we needed urine specimen. Patient given a cup of water.

## 2018-05-02 NOTE — ED Provider Notes (Signed)
[facility]ERGENCY DEPARTMENT Provider Note   CSN: [id number] Arrival date & time: 05/02/18  1119     History   Chief Complaint Chief Complaint  Patient presents with  . V70.1    HPI Brandon Cortez is a 22 y.o. male.  HPI 22 yo male co depression due to situation with gf and child. Massaging mother on computer and told her he didin't want live.  She called police and they found him in bathtub.  He states he got in tub and doesn't remember after that.  He was found in the tub clothes on and eyes closed.  Immediately responded with tactile and verbal stimuli.   NO drug paraphenalia.  Patient denies significant ho of depression, or hospitalization.  History reviewed. No pertinent past medical history.  There are no active problems to display for this patient.   History reviewed. No pertinent surgical history.      Home Medications    Prior to Admission medications   Medication Sig Start Date End Date Taking? Authorizing Provider  ondansetron (ZOFRAN ODT) 4 MG disintegrating tablet Take 1 tablet (4 mg total) by mouth every 8 (eight) hours as needed for nausea or vomiting. 11/17/17   Belinda FisherYu, Amy V, PA-C    Family History No family history on file.  Social History Social History   Tobacco Use  . Smoking status: Current Some Day Smoker  . Smokeless tobacco: Never Used  Substance Use Topics  . Alcohol use: No  . Drug use: Yes    Types: Marijuana     Allergies   Patient has no known allergies.   Review of Systems Review of Systems  All other systems reviewed and are negative.    Physical Exam Updated Vital Signs BP 135/74   Pulse (!) 44   Resp 14   Wt 93 kg (205 lb)   SpO2 100%   BMI 26.32 kg/m   Physical Exam  Constitutional: He is oriented to person, place, and time. He appears well-developed and well-nourished.  HENT:  Head: Normocephalic and atraumatic.  Right Ear: External ear normal.  Left Ear: External ear normal.  Mouth/Throat: Oropharynx  is clear and moist.  Eyes: Pupils are equal, round, and reactive to light. EOM are normal.  Neck: Normal range of motion. Neck supple.  Cardiovascular: Normal rate and regular rhythm.  Pulmonary/Chest: Effort normal and breath sounds normal.  Abdominal: Soft. Bowel sounds are normal.  Musculoskeletal: Normal range of motion.  Neurological: He is alert and oriented to person, place, and time.  Skin: Skin is warm and dry.  Psychiatric: He exhibits a depressed mood.  crying  Nursing note and vitals reviewed.    ED Treatments / Results  Labs (all labs ordered are listed, but only abnormal results are displayed) Labs Reviewed - No data to display  EKG None  Radiology No results found.  Procedures Procedures (including critical care time)  Medications Ordered in ED Medications - No data to display   Initial Impression / Assessment and Plan / ED Course  I have reviewed the triage vital signs and the nursing notes.  Pertinent labs & imaging results that were available during my care of the patient were reviewed by me and considered in my medical decision making (see chart for details).    22 year old male presents today complaining of depression.  He has expressed that he "wishes to go to sleep".  He has no plan for self-harm at this time.  Denies any previous suicide attempts.  He denies any wish to harm anyone else.  He appears stable here in the ED and TTS consult is being obtained  Final Clinical Impressions(s) / ED Diagnoses   Final diagnoses:  None    ED Discharge Orders    None       Margarita Grizzle, MD 05/03/18 [phone]

## 2018-05-03 DIAGNOSIS — F322 Major depressive disorder, single episode, severe without psychotic features: Principal | ICD-10-CM

## 2018-05-03 DIAGNOSIS — Z818 Family history of other mental and behavioral disorders: Secondary | ICD-10-CM

## 2018-05-03 DIAGNOSIS — F1210 Cannabis abuse, uncomplicated: Secondary | ICD-10-CM

## 2018-05-03 LAB — TSH: TSH: 1.883 u[IU]/mL (ref 0.350–4.500)

## 2018-05-03 MED ORDER — FLUOXETINE HCL 10 MG PO CAPS
10.0000 mg | ORAL_CAPSULE | Freq: Every day | ORAL | Status: DC
  Administered 2018-05-03 – 2018-05-04 (×2): 10 mg via ORAL
  Filled 2018-05-03 (×4): qty 1

## 2018-05-03 NOTE — Progress Notes (Signed)
DAR Note: Pt with flat affect continue to endorse moderate depression; "every now and then it hits me and I became depressed; it has gotten better since I got here." Pt at the time of assessment denied SI, HI or pain.  All patient's questions and concerns addressed. Support, encouragement, and safe environment provided. 15-minute safety checks continue. Safety checks continue. Pt continue to be med compliant.

## 2018-05-03 NOTE — H&P (Signed)
Psychiatric Admission Assessment Adult  Patient Identification: Brandon Cortez MRN:  [id number] Date of Evaluation:  05/03/2018 Chief Complaint:  mdd Principal Diagnosis: MDD (major depressive disorder), single episode, severe (HCC) Diagnosis:   Patient Active Problem List   Diagnosis Date Noted  . MDD (major depressive disorder), single episode, severe (HCC) [F32.2] 05/02/2018   History of Present Illness: Patient is seen and examined.  Patient is a 22 year old male with a past psychiatric history significant for anxiety, panic attacks and depression who presented to the [facility] emergency department on 05/02/2018.  He was found in his house in the bathtub with water up to his neck, but at that time denied that he was trying to hurt himself.  Apparently he had sent a text to his mother stating "life is too much pain right now, I do not want it".  The mother called the police for safety check.  The police found him there and brought him to the hospital.  The patient stated that he had been under increasing recent psychosocial stressors.  He had moved from [place] to home with his girlfriend and their 47-year-old child.  It was several months ago.  It had not gone well.  The girlfriend moved with her child back to her mother and father's.  That had made him very upset.  He also had some trouble finding a job, but ended up getting one.  He stated he had been in the emergency room locally several times in the past with anxiety attacks and panic attacks.  He admitted that his mother had been admitted to the psychiatric hospital on several occasions.  He was seen in the emergency room and then transferred to our facility for evaluation and stabilization.  The patient admitted to crying spells, feeling helpless, hopeless and worthless.  He denied any current suicidality.  He did admit to racing thoughts, anxiety and poor sleep.  He denied any previous episodes of euphoria or excessive spending.  No real  increase in goal-directed activity. Associated Signs/Symptoms: Depression Symptoms:  depressed mood, anhedonia, insomnia, psychomotor agitation, fatigue, feelings of worthlessness/guilt, difficulty concentrating, hopelessness, suicidal thoughts without plan, anxiety, panic attacks, loss of energy/fatigue, disturbed sleep, (Hypo) Manic Symptoms:  Impulsivity, Irritable Mood, Labiality of Mood, Anxiety Symptoms:  Excessive Worry, Panic Symptoms, Psychotic Symptoms:  Denied PTSD Symptoms: Negative Total Time spent with patient: 30 minutes  Past Psychiatric History: Patient denied any previous psychiatric admissions, psychiatric medications long-term, or psychiatric treatment.  Is the patient at risk to self? Yes.    Has the patient been a risk to self in the past 6 months? No.  Has the patient been a risk to self within the distant past? No.  Is the patient a risk to others? No.  Has the patient been a risk to others in the past 6 months? No.  Has the patient been a risk to others within the distant past? No.   Prior Inpatient Therapy:   Prior Outpatient Therapy:    Alcohol Screening: Patient refused Alcohol Screening Tool: Yes 1. How often do you have a drink containing alcohol?: Never 3. How often do you have six or more drinks on one occasion?: Never Intervention/Follow-up: AUDIT Score <7 follow-up not indicated Substance Abuse History in the last 12 months:  Yes.   Consequences of Substance Abuse: Negative Previous Psychotropic Medications: No  Psychological Evaluations: No  Past Medical History:  Past Medical History:  Diagnosis Date  . Medical history non-contributory    History reviewed. No  pertinent surgical history. Family History: History reviewed. No pertinent family history. Family Psychiatric  History: Mother apparently has some significant psychiatric issues.  She has had multiple hospitalizations, but he is unclear what her diagnosis is. Tobacco  Screening: Have you used any form of tobacco in the last 30 days? (Cigarettes, Smokeless Tobacco, Cigars, and/or Pipes): No Social History:  Social History   Substance and Sexual Activity  Alcohol Use No     Social History   Substance and Sexual Activity  Drug Use Yes  . Types: Marijuana    Additional Social History:        Name of Substance 1: THC  1 - Age of First Use: 9th grade 1 - Amount (size/oz): Smokes twice a day.  "Controls my anger." 1 - Frequency: daily 1 - Duration: ongoing 1 - Last Use / Amount: yesterday                  Allergies:  No Known Allergies Lab Results:  Results for orders placed or performed during the hospital encounter of 05/02/18 (from the past 48 hour(s))  TSH     Status: None   Collection Time: 05/03/18  6:49 AM  Result Value Ref Range   TSH 1.883 0.350 - 4.500 uIU/mL    Comment: Performed by a 3rd Generation assay with a functional sensitivity of <=0.01 uIU/mL. Performed at [facility], [address] [place], [place] [address]     Blood Alcohol level:  Lab Results  Component Value Date   ETH <10 05/02/2018    Metabolic Disorder Labs:  No results found for: HGBA1C, MPG No results found for: PROLACTIN No results found for: CHOL, TRIG, HDL, CHOLHDL, VLDL, LDLCALC  Current Medications: Current Facility-Administered Medications  Medication Dose Route Frequency Provider Last Rate Last Dose  . acetaminophen (TYLENOL) tablet 650 mg  650 mg Oral Q6H PRN Mariel CraftMaurer, Sheila M, MD      . alum & mag hydroxide-simeth (MAALOX/MYLANTA) 200-200-20 MG/5ML suspension 30 mL  30 mL Oral Q4H PRN Mariel CraftMaurer, Sheila M, MD      . diphenhydrAMINE (BENADRYL) capsule 25 mg  25 mg Oral Q6H PRN Mariel CraftMaurer, Sheila M, MD      . FLUoxetine (PROZAC) capsule 10 mg  10 mg Oral Daily Antonieta Pertlary, Keyera Hattabaugh Lawson, MD   10 mg at 05/03/18 1058  . hydrOXYzine (ATARAX/VISTARIL) tablet 25 mg  25 mg Oral TID PRN Mariel CraftMaurer, Sheila M, MD   25 mg at 05/02/18 2151  .  magnesium hydroxide (MILK OF MAGNESIA) suspension 30 mL  30 mL Oral Daily PRN Mariel CraftMaurer, Sheila M, MD      . traZODone (DESYREL) tablet 50 mg  50 mg Oral QHS PRN,MR X 1 Mariel CraftMaurer, Sheila M, MD   50 mg at 05/02/18 2151   PTA Medications: Medications Prior to Admission  Medication Sig Dispense Refill Last Dose  . ondansetron (ZOFRAN ODT) 4 MG disintegrating tablet Take 1 tablet (4 mg total) by mouth every 8 (eight) hours as needed for nausea or vomiting. (Patient not taking: Reported on 05/02/2018) 15 tablet 0 Completed Course at Unknown time    Musculoskeletal: Strength & Muscle Tone: within normal limits Gait & Station: normal Patient leans: N/A  Psychiatric Specialty Exam: Physical Exam  Nursing note and vitals reviewed. Constitutional: He is oriented to person, place, and time. He appears well-developed and well-nourished.  HENT:  Head: Normocephalic and atraumatic.  Respiratory: Effort normal.  Neurological: He is alert and oriented to person, place, and time.  ROS  Blood pressure 130/84, pulse 86, temperature 98.3 F (36.8 C), temperature source Oral, resp. rate 16, height 6\' 1"  (1.854 m), weight 89.4 kg (197 lb), SpO2 100 %.Body mass index is 25.99 kg/m.  General Appearance: Casual  Eye Contact:  Fair  Speech:  Normal Rate  Volume:  Normal  Mood:  Anxious and Depressed  Affect:  Congruent  Thought Process:  Coherent  Orientation:  Full (Time, Place, and Person)  Thought Content:  Logical  Suicidal Thoughts:  Yes.  without intent/plan  Homicidal Thoughts:  No  Memory:  Immediate;   Fair Recent;   Fair Remote;   Fair  Judgement:  Intact  Insight:  Fair  Psychomotor Activity:  Increased  Concentration:  Concentration: Fair and Attention Span: Fair  Recall:  [employer] of Knowledge:  Fair  Language:  Good  Akathisia:  Negative  Handed:  Right  AIMS (if indicated):     Assets:  Communication Skills Desire for Improvement Housing Physical Health Resilience Social  Support Talents/Skills  ADL's:  Intact  Cognition:  WNL  Sleep:  Number of Hours: 6.75    Treatment Plan Summary: Daily contact with patient to assess and evaluate symptoms and progress in treatment, Medication management and Plan : Patient is seen and examined.  Patient is a 22 year old male with a probable past psychiatric history significant for major depression; single episode, severe without psychotic features as well as generalized anxiety disorder and panic disorder.  He will be admitted to the hospital.  He will be integrated into the milieu.  He will meet with social work individually as well as in groups.  He will be encouraged to attend groups and work on his coping skills.  He will be placed on 15-minute checks for safety.  He will be started on fluoxetine 10 mg p.o. daily.  We will collect collateral information from his family members with regard to his history.  Observation Level/Precautions:  15 minute checks  Laboratory:  Chemistry Profile  Psychotherapy:    Medications:    Consultations:    Discharge Concerns:    Estimated LOS:  Other:     Physician Treatment Plan for Primary Diagnosis: MDD (major depressive disorder), single episode, severe (HCC) Long Term Goal(s): Improvement in symptoms so as ready for discharge  Short Term Goals: Ability to identify changes in lifestyle to reduce recurrence of condition will improve, Ability to verbalize feelings will improve, Ability to disclose and discuss suicidal ideas, Ability to demonstrate self-control will improve, Ability to identify and develop effective coping behaviors will improve, Ability to maintain clinical measurements within normal limits will improve and Ability to identify triggers associated with substance abuse/mental health issues will improve  Physician Treatment Plan for Secondary Diagnosis: Principal Problem:   MDD (major depressive disorder), single episode, severe (HCC)  Long Term Goal(s): Improvement in  symptoms so as ready for discharge  Short Term Goals: Ability to identify changes in lifestyle to reduce recurrence of condition will improve, Ability to verbalize feelings will improve, Ability to disclose and discuss suicidal ideas, Ability to demonstrate self-control will improve, Ability to identify and develop effective coping behaviors will improve, Ability to maintain clinical measurements within normal limits will improve and Ability to identify triggers associated with substance abuse/mental health issues will improve  I certify that inpatient services furnished can reasonably be expected to improve the patient's condition.    [name], MD 8/2/20191:21 PM

## 2018-05-03 NOTE — Progress Notes (Signed)
Patient self inventory- Patient slept well last night, sleep medication was requested and was helpdul. Appetite has been good, energy level normal, concentration good. Patient rates depression, hopelessness, and anxiety 4, 2, 0, with 10 being the highest. Denies SI HI AVH. Denies physcial pain and physical problems. Patient's goal is "keep working and focusing on me."  Patient compliant with medications prescribed per provider. Safety maintained with 15 minute checks as well as environmental checks. Will continue to monitor.

## 2018-05-03 NOTE — BHH Counselor (Signed)
Adult Comprehensive Assessment  Patient ID: MARTELL MCFADYEN, male   DOB: 1996/01/19, 22 y.o.   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  Bereavement / Loss: Patient denies any stressors   Living/Environment/Situation:  Living Arrangements: Spouse/significant other, Children Living conditions (as described by patient or guardian): "Good and safe" Who else lives in the home?: Girlfriend and 12 year old son How long has patient lived in current situation?: 4-5 months  What is atmosphere in current home: Comfortable  Family History:  Marital status: Long term relationship Long term relationship, how long?: 4 years  What types of issues is patient dealing with in the relationship?: Communication issues  Additional relationship information: No  Are you sexually active?: Yes What is your sexual orientation?: Heterosexual  Has your sexual activity been affected by drugs, alcohol, medication, or emotional stress?: No  Does patient have children?:  Yes How many children?: 1 How is patient's relationship with their children?: Patient reports having a good relationship with his 75 year old son. Patient reports recently he has not seen his son due to conflicts with his girlfriend.   Childhood History:  By whom was/is the patient raised?: Mother Description of patient's relationship with caregiver when they were a child: Patient reports his relationship with his mother was good until she re-married during his childhood.  Patient's description of current relationship with people who raised him/her: Patient reports he and his mother have a good relationship currently.  How were you disciplined when you got in trouble as a child/adolescent?: Whoopings Does patient have siblings?: Yes Number of Siblings: 13 Description of patient's current relationship with siblings: Patient reports he is his mother's only child, however his father has 13 more children. Patient reports he has a relationship with some of his siblings.  Did patient suffer any verbal/emotional/physical/sexual abuse as a child?: No Did patient suffer from severe childhood neglect?: No Has patient ever been sexually abused/assaulted/raped as an adolescent or adult?: No Was the patient ever a victim of a crime or a disaster?: No Witnessed domestic violence?: No Has patient been effected by domestic violence as an adult?: No  Education:  Highest grade of school patient has completed: 12th grade  Currently a student?: No Learning disability?: No  Employment/Work Situation:   Employment situation: Employed Where is patient currently employed?: [employer] long has patient been employed?: 1 months  Patient's job has been impacted by current illness: No What is the longest time patient has a held a job?: 2 years  Where was the patient employed at that time?: [employer]  Did You Receive Any Psychiatric Treatment/Services While in [employer]?: No Are There Guns  or Other Weapons  in Your Home?: Yes Types of Guns/Weapons: handgun/pistol  Are These Weapons Safely Secured?: Yes  Financial Resources:   Financial resources: Income from employment, Private insurance Does patient have a representative payee or guardian?: No  Alcohol/Substance Abuse:   What has been your use of drugs/alcohol within the last 12 months?: Patient reports smoking cannabis.  If attempted suicide, did drugs/alcohol play a role in this?: No Alcohol/Substance Abuse Treatment Hx: Denies past history Has alcohol/substance abuse ever caused legal problems?: No  Social Support System:   Patient's Community Support System: Fair Development worker, communityDescribe Community Support System: "My mom" Type of faith/religion: None How does patient's faith help to cope with current illness?: N/A  Leisure/Recreation:   Leisure and Hobbies: "I play semi-pro football"  Strengths/Needs:   What is the patient's perception of their strengths?: "I'm good with people" Patient states they can use these personal strengths during their treatment to contribute to their recovery: Yes Patient states these barriers may affect/interfere with their treatment: No  Patient states these barriers may affect their return to the community: No Other important information patient would like considered in planning for their treatment: No   Discharge Plan:   Currently receiving community mental health services: No Patient states concerns and preferences for aftercare planning are: Outpatient medication management and therapy services  Patient states they will know when they are safe and ready for discharge when: Yes Does patient have access to transportation?: Yes Does patient have financial barriers related to discharge medications?: No Will patient be returning to same living situation after discharge?: Yes  Summary/Recommendations:   Summary and Recommendations (to be completed by the evaluator): Karen ChafeJahru is a 22 year old male who is diagnosed with  MDD, single episode, severe. He presented to the hospital seeking treatment for worsening depressive symptoms, anxiety and panic attacks. Journey was pleasant and cooperative with providing information. Hunner reports that he does not think he needs to be in the hospital long because he is fine. He reports that he had a mental breakdown due to him and his girlfriend not communicating and him not seeing his son. Raider reports that he is open to having an outpatient provider, however he does not think he will need it. Lendell reports he and his girlfriend have worked out their differences, and that he plans to return to their home in [place] at discharge. Sacha can benefit from crisis stabilization, medication management, therapeutic milieu and referral services.   Maeola SarahJolan E Keondria Siever. 05/03/2018

## 2018-05-03 NOTE — Plan of Care (Signed)
  Problem: Education: Goal: Knowledge of [facility] Education information/materials will improve Outcome: Progressing   Problem: Education: Goal: Mental status will improve Outcome: Progressing   Problem: Education: Goal: Verbalization of understanding the information provided will improve Outcome: Progressing   Problem: Education: Goal: Emotional status will improve Outcome: Progressing

## 2018-05-03 NOTE — Tx Team (Signed)
Interdisciplinary Treatment and Diagnostic Plan Update  05/03/2018 Time of Session: 9:35am Brandon Cortez MRN: [id number]  Principal Diagnosis: MDD (major depressive disorder), single episode, severe ([place])  Secondary Diagnoses: Principal Problem:   MDD (major depressive disorder), single episode, severe ([place])   Current Medications:  Current Facility-Administered Medications  Medication Dose Route Frequency Provider Last Rate Last Dose  . acetaminophen (TYLENOL) tablet 650 mg  650 mg Oral Q6H PRN Lavella Hammock, MD      . alum & mag hydroxide-simeth (MAALOX/MYLANTA) 200-200-20 MG/5ML suspension 30 mL  30 mL Oral Q4H PRN Lavella Hammock, MD      . diphenhydrAMINE (BENADRYL) capsule 25 mg  25 mg Oral Q6H PRN Lavella Hammock, MD      . hydrOXYzine (ATARAX/VISTARIL) tablet 25 mg  25 mg Oral TID PRN Lavella Hammock, MD   25 mg at 05/02/18 2151  . magnesium hydroxide (MILK OF MAGNESIA) suspension 30 mL  30 mL Oral Daily PRN Lavella Hammock, MD      . traZODone (DESYREL) tablet 50 mg  50 mg Oral QHS PRN,MR X 1 Lavella Hammock, MD   50 mg at 05/02/18 2151   PTA Medications: Medications Prior to Admission  Medication Sig Dispense Refill Last Dose  . ondansetron (ZOFRAN ODT) 4 MG disintegrating tablet Take 1 tablet (4 mg total) by mouth every 8 (eight) hours as needed for nausea or vomiting. (Patient not taking: Reported on 05/02/2018) 15 tablet 0 Completed Course at Unknown time    Patient Stressors: Loss of relationship Other: Isolation  Patient Strengths: Ability for insight Average or above average intelligence Communication skills General fund of knowledge Motivation for treatment/growth Supportive family/friends  Treatment Modalities: Medication Management, Group therapy, Case management,  1 to 1 session with clinician, Psychoeducation, Recreational therapy.   Physician Treatment Plan for Primary Diagnosis: MDD (major depressive disorder), single episode, severe ([place]) Long  Term Goal(s):     Short Term Goals:    Medication Management: Evaluate patient's response, side effects, and tolerance of medication regimen.  Therapeutic Interventions: 1 to 1 sessions, Unit Group sessions and Medication administration.  Evaluation of Outcomes: Not Met  Physician Treatment Plan for Secondary Diagnosis: Principal Problem:   MDD (major depressive disorder), single episode, severe ([place])  Long Term Goal(s):     Short Term Goals:       Medication Management: Evaluate patient's response, side effects, and tolerance of medication regimen.  Therapeutic Interventions: 1 to 1 sessions, Unit Group sessions and Medication administration.  Evaluation of Outcomes: Not Met   RN Treatment Plan for Primary Diagnosis: MDD (major depressive disorder), single episode, severe ([place]) Long Term Goal(s): Knowledge of disease and therapeutic regimen to maintain health will improve  Short Term Goals: Ability to disclose and discuss suicidal ideas, Ability to identify and develop effective coping behaviors will improve and Compliance with prescribed medications will improve  Medication Management: RN will administer medications as ordered by provider, will assess and evaluate patient's response and provide education to patient for prescribed medication. RN will report any adverse and/or side effects to prescribing provider.  Therapeutic Interventions: 1 on 1 counseling sessions, Psychoeducation, Medication administration, Evaluate responses to treatment, Monitor vital signs and CBGs as ordered, Perform/monitor CIWA, COWS, AIMS and Fall Risk screenings as ordered, Perform wound care treatments as ordered.  Evaluation of Outcomes: Not Met   LCSW Treatment Plan for Primary Diagnosis: MDD (major depressive disorder), single episode, severe ([place]) Long Term Goal(s): Safe transition to appropriate next  level of care at discharge, Engage patient in therapeutic group addressing interpersonal  concerns.  Short Term Goals: Engage patient in aftercare planning with referrals and resources  Therapeutic Interventions: Assess for all discharge needs, 1 to 1 time with Social worker, Explore available resources and support systems, Assess for adequacy in community support network, Educate family and significant other(s) on suicide prevention, Complete Psychosocial Assessment, Interpersonal group therapy.  Evaluation of Outcomes: Not Met   Progress in Treatment: Attending groups: No. New to unit Participating in groups: No. Taking medication as prescribed: Yes. Toleration medication: Yes. Family/Significant other contact made: No, will contact:  if patient consents Patient understands diagnosis: Yes. Discussing patient identified problems/goals with staff: Yes. Medical problems stabilized or resolved: Yes. Denies suicidal/homicidal ideation: Yes. Issues/concerns per patient self-inventory: No. Other:   New problem(s) identified: None  New Short Term/Long Term Goal(s): medication stabilization, elimination of SI thoughts, development of comprehensive mental wellness plan.   Patient Goals:  I will like to learn coping skills for depression and help with my relationship  Discharge Plan or Barriers: CSW will continue to assess for appropriate referrals and discharge planning   Reason for Continuation of Hospitalization: Depression Medication stabilization Suicidal ideation  Estimated Length of Stay: 3-5 days   Attendees: Patient: 05/03/2018 8:57 AM  Physician: Dr. Marya Amsler Clary,MD 05/03/2018 8:57 AM  Nursing: Alric Quan 05/03/2018 8:57 AM  RN Care Manager: Rhunette Croft 05/03/2018 8:57 AM  Social Worker: Theresa Duty 05/03/2018 8:57 AM  Recreational Therapist: Rhunette Croft 05/03/2018 8:57 AM  Other: X 05/03/2018 8:57 AM  Other: X 05/03/2018 8:57 AM  Other:X 05/03/2018 8:57 AM    Scribe for Treatment Team: Marylee Floras, [place] 05/03/2018 8:57 AM

## 2018-05-03 NOTE — Progress Notes (Signed)
Adult Psychoeducational Group Note  Date:  05/03/2018 Time:  10:28 AM  Group Topic/Focus:  Orientation:   The focus of this group is to educate the patient on the purpose and policies of crisis stabilization and provide a format to answer questions about their admission.  The group details unit policies and expectations of patients while admitted.  Participation Level:  Active  Participation Quality:  Appropriate  Affect:  Appropriate  Cognitive:  Appropriate  Insight: Appropriate  Engagement in Group:  Engaged  Modes of Intervention:  Discussion and Education  Additional Comments:    Pt participated in orientation/ goals group. Pt's goal today is to stay positive and talk to new people. Pt plans to stay positive by blocking the negative thought and keeping himself distracted.   Karren CobbleFizah G Corrisa Gibby 05/03/2018, 10:28 AM

## 2018-05-03 NOTE — Progress Notes (Signed)
Recreation Therapy Notes  Date: 8.2.19 Time: 0930 Location: [address] Dayroom  Group Topic: Stress Management  Goal Area(s) Addresses:  Patient will verbalize importance of using healthy stress management.  Patient will identify positive emotions associated with healthy stress management.   Intervention: Stress Management  Activity : Meditation.  LRT introduced the stress management technique of meditation.  LRT played a meditation on gratitude.  Patients were to follow along as meditation played to engage in activity.  Education:  Stress Management, Discharge Planning.   Education Outcome: Acknowledges edcuation/In group clarification offered/Needs additional education  Clinical Observations/Feedback: Pt did not attend group.     Caroll RancherMarjette Blong Busk, LRT/CTRS         Lillia AbedLindsay, Tagen Milby A 05/03/2018 11:08 AM

## 2018-05-03 NOTE — Progress Notes (Signed)
Pt attend wrap up group. His day was a 6. He found a friend because he was fuel over dump stuff. Pt said this is the first time he is focus on his self. His life. He has always tried to help others a neglect himself.

## 2018-05-03 NOTE — B[facility] Group Notes (Signed)
Adult Psychoeducational Group Note   Date:  05/03/2018  Time: 4:00 PM   Group Topic/Focus: Music as a Coping Skill Patients choose a song of significance to and explain to the group how the song has helped with their recovery.   Participation Level:  Active   Participation Quality:  Redirectable   Affect:  Silly   Cognitive:  Alert and Oriented   Insight: Limited   Engagement in Group:  Developing/Improving   Modes of Intervention:  Discussion, Activity, and Education   Additional Comments:  Patient attended group late and was inappropriately having conversations with peers during group. Patient was laughing and required redirection.   Marchelle Folksmanda A Antiono Ettinger 05/03/2018 5:00 PM

## 2018-05-03 NOTE — [facility] Suicide Risk Assessment (Signed)
[facility]   Nursing information obtained from:  Patient Demographic factors:  Male, Adolescent or young adult, Access to firearms Current Mental Status:  Suicide plan Loss Factors:  Loss of significant relationship Historical Factors:  Family history of mental illness or substance abuse Risk Reduction Factors:  Responsible for children under 22 years of age, Sense of responsibility to family, Employed, Positive therapeutic relationship  Total Time spent with patient: 30 minutes Principal Problem: MDD (major depressive disorder), single episode, severe (HCC) Diagnosis:   Patient Active Problem List   Diagnosis Date Noted  . MDD (major depressive disorder), single episode, severe (HCC) [F32.2] 05/02/2018   Subjective Data: Patient is seen and examined.  Patient is a 22 year old male with a past psychiatric history significant for anxiety, panic attacks and depression who presented to the [facility] emergency department on 05/02/2018.  The patient was found in his house in a bathtub with water up to his neck, but he denied at that point that he was trying to hurt himself.  Apparently he had also sent a text to his mother stating "life since too much pain right now, I do not want it".  The patient stated that he had been under increased recent psychosocial stressors.  He had moved into a home with his girlfriend and 22-year-old child.  That was several months ago.  It had not gone well, and the girlfriend had moved out approximately a month ago with the child.  That made him very upset.  He also had had trouble finding a job, but he ended up getting one.  He stated he had been in the emergency room several times in the past with anxiety attacks and panic disorder.  He admitted that his mother had been admitted to the psychiatric hospital on several occasions.  After the mother received a text she sent a well check to the house, and the police found the patient in the tub.  He  was seen in the emergency room, and then transferred to our facility for evaluation and stabilization.  The patient admits to crying spells, feeling helpless, hopeless and worthless.  He denied any current suicidality.  He did admit to racing thoughts, anxiety and poor sleep.  He denied any previous episodes of euphoria or excessive spending.  No real increase in goal-directed activity.  Continued Clinical Symptoms:    The "Alcohol Use Disorders Identification Test", Guidelines for Use in Primary Care, Second Edition.  World Science writerHealth Organization [facility](WHO). Score between 0-7:  no or low risk or alcohol related problems. Score between 8-15:  moderate risk of alcohol related problems. Score between 16-19:  high risk of alcohol related problems. Score 20 or above:  warrants further diagnostic evaluation for alcohol dependence and treatment.   CLINICAL FACTORS:   Severe Anxiety and/or Agitation Panic Attacks Depression:   Aggression Anhedonia Hopelessness Impulsivity Insomnia   Musculoskeletal: Strength & Muscle Tone: within normal limits Gait & Station: normal Patient leans: N/A  Psychiatric Specialty Exam: Physical Exam  Nursing note and vitals reviewed. Constitutional: He is oriented to person, place, and time. He appears well-developed and well-nourished.  HENT:  Head: Normocephalic and atraumatic.  Respiratory: Effort normal.  Neurological: He is alert and oriented to person, place, and time.    ROS  Blood pressure 130/84, pulse 86, temperature 98.3 F (36.8 C), temperature source Oral, resp. rate 16, height 6\' 1"  (1.854 m), weight 89.4 kg (197 lb), SpO2 100 %.Body mass index is 25.99 kg/m.  General  Appearance: Disheveled  Eye Contact:  Fair  Speech:  Clear and Coherent  Volume:  Normal  Mood:  Anxious  Affect:  Congruent  Thought Process:  Coherent  Orientation:  Full (Time, Place, and Person)  Thought Content:  Logical  Suicidal Thoughts:  No  Homicidal Thoughts:  No   Memory:  Immediate;   Fair Recent;   Fair Remote;   Fair  Judgement:  Intact  Insight:  Fair  Psychomotor Activity:  Increased  Concentration:  Concentration: Fair and Attention Span: Fair  Recall:  [employer] of Knowledge:  Fair  Language:  Fair  Akathisia:  Negative  Handed:  Right  AIMS (if indicated):     Assets:  Communication Skills Desire for Improvement Financial Resources/Insurance Housing Physical Health Resilience Social Support Talents/Skills  ADL's:  Intact  Cognition:  WNL  Sleep:  Number of Hours: 6.75      COGNITIVE FEATURES THAT CONTRIBUTE TO RISK:  None    SUICIDE RISK:   Minimal: No identifiable suicidal ideation.  Patients presenting with no risk factors but with morbid ruminations; may be classified as minimal risk based on the severity of the depressive symptoms  PLAN OF CARE: Patient is seen and examined.  Patient is a 22 year old male with a probable past psychiatric history significant for major depression, single episode, severe without psychotic features as well as generalized anxiety disorder and panic disorder.  He will be admitted to the hospital.  He will be integrated into the milieu.  He will meet with social work individually as well as in groups.  He will be encouraged to go to groups.  He will be encouraged to work on his coping skills.  He will be placed on 15-minute checks.  He will be started on fluoxetine 10 mg p.o. daily.  We will collect collateral information with regard to his history.  I certify that inpatient services furnished can reasonably be expected to improve the patient's condition.   [name], MD 05/03/2018, 9:58 AM

## 2018-05-04 MED ORDER — FLUOXETINE HCL 10 MG PO CAPS
10.0000 mg | ORAL_CAPSULE | Freq: Once | ORAL | Status: AC
  Administered 2018-05-04: 10 mg via ORAL
  Filled 2018-05-04: qty 1

## 2018-05-04 MED ORDER — FLUOXETINE HCL 20 MG PO CAPS
20.0000 mg | ORAL_CAPSULE | Freq: Every day | ORAL | Status: DC
  Administered 2018-05-05 – 2018-05-06 (×2): 20 mg via ORAL
  Filled 2018-05-04: qty 7
  Filled 2018-05-04 (×3): qty 1

## 2018-05-04 NOTE — BHH Counselor (Signed)
Patient requested a 1:1. Patient stated he is seeing all these people discharging and some that got here after he did. Patient reports feeling ready to go home. Patient shared he is a little sad because today is the first football game ever that he cannot play but reports feeling as if he shouldn't be here.

## 2018-05-04 NOTE — Progress Notes (Signed)
[facility] MD Progress Note  05/04/2018 3:02 PM Brandon Cortez  MRN:  [id number] Subjective: Patient is a 22 year old male with a past psychiatric history significant for generalized anxiety, panic attacks and major depression who was admitted on 05/03/2018.  He was having suicidal ideation at that time.  Patient stated he was more anxious today.  He stated that he does not feel as though it belongs here.  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  He also disclosed that he did not have a key to the home that he was staying at.  He is not sure where he is can ago when he gets discharged.  He feels out of place here.  He had an acquaintance who was also a patient here, and that made him feel more comfortable, but now that patient's been discharged.  He denied any suicidal ideation, but his anxiety is significantly higher today than yesterday. Principal Problem: MDD (major depressive disorder), single episode, severe (HCC) Diagnosis:   Patient Active Problem List   Diagnosis Date Noted  . MDD (major depressive disorder), single episode, severe (HCC) [F32.2] 05/02/2018   Total Time spent with patient: 15 minutes  Past Psychiatric History: The admission H&P  Past Medical History:  Past Medical History:  Diagnosis Date  . Medical history non-contributory    History reviewed. No pertinent surgical history. Family History: History reviewed. No pertinent family history. Family Psychiatric  History: See admission H&P Social History:  Social History   Substance and Sexual Activity  Alcohol Use No     Social History   Substance and Sexual Activity  Drug Use Yes  . Types: Marijuana    Social History   Socioeconomic History  . Marital status: Single    Spouse name: Not on file  . Number of children: Not on file  . Years of education: Not on file  . Highest education level: Not on file  Occupational History  . Not on  file  Social Needs  . Financial resource strain: Not on file  . Food insecurity:    Worry: Not on file    Inability: Not on file  . Transportation needs:    Medical: Not on file    Non-medical: Not on file  Tobacco Use  . Smoking status: Never Smoker  . Smokeless tobacco: Never Used  Substance and Sexual Activity  . Alcohol use: No  . Drug use: Yes    Types: Marijuana  . Sexual activity: Yes  Lifestyle  . Physical activity:    Days per week: Not on file    Minutes per session: Not on file  . Stress: Not on file  Relationships  . Social connections:    Talks on phone: Not on file    Gets together: Not on file    Attends religious service: Not on file    Active member of club or organization: Not on file    Attends meetings of clubs or organizations: Not on file    Relationship status: Not on file  Other Topics Concern  . Not on file  Social History Narrative  . Not on file   Additional Social History:      Name of Substance 1: THC  1 - Age of First Use: 9th grade 1 - Amount (size/oz): Smokes twice a day.  "Controls my anger." 1 - Frequency: daily 1 - Duration: ongoing 1 - Last Use /  Amount: yesterday                  Sleep: Fair  Appetite:  Good  Current Medications: Current Facility-Administered Medications  Medication Dose Route Frequency Provider Last Rate Last Dose  . acetaminophen (TYLENOL) tablet 650 mg  650 mg Oral Q6H PRN Mariel Craft, MD      . alum & mag hydroxide-simeth (MAALOX/MYLANTA) 200-200-20 MG/5ML suspension 30 mL  30 mL Oral Q4H PRN Mariel Craft, MD      . diphenhydrAMINE (BENADRYL) capsule 25 mg  25 mg Oral Q6H PRN Mariel Craft, MD      . FLUoxetine (PROZAC) capsule 10 mg  10 mg Oral Once [name], MD      . Melene Muller ON 05/05/2018] FLUoxetine (PROZAC) capsule 20 mg  20 mg Oral Daily [name], MD      . hydrOXYzine (ATARAX/VISTARIL) tablet 25 mg  25 mg Oral TID PRN Mariel Craft, MD   25 mg at 05/02/18  2151  . magnesium hydroxide (MILK OF MAGNESIA) suspension 30 mL  30 mL Oral Daily PRN Mariel Craft, MD      . traZODone (DESYREL) tablet 50 mg  50 mg Oral QHS PRN,MR X 1 Mariel Craft, MD   50 mg at 05/02/18 2151    Lab Results:  Results for orders placed or performed during the hospital encounter of 05/02/18 (from the past 48 hour(s))  TSH     Status: None   Collection Time: 05/03/18  6:49 AM  Result Value Ref Range   TSH 1.883 0.350 - 4.500 uIU/mL    Comment: Performed by a 3rd Generation assay with a functional sensitivity of <=0.01 uIU/mL. Performed at [facility], [address] [place], [place] [address]     Blood Alcohol level:  Lab Results  Component Value Date   ETH <10 05/02/2018    Metabolic Disorder Labs: No results found for: HGBA1C, MPG No results found for: PROLACTIN No results found for: CHOL, TRIG, HDL, CHOLHDL, VLDL, LDLCALC  Physical Findings: AIMS: Facial and Oral Movements Muscles of Facial Expression: None, normal Lips and Perioral Area: None, normal Jaw: None, normal Tongue: None, normal,Extremity Movements Upper (arms, wrists, hands, fingers): None, normal Lower (legs, knees, ankles, toes): None, normal, Trunk Movements Neck, shoulders, hips: None, normal, Overall Severity Severity of abnormal movements (highest score from questions above): None, normal Incapacitation due to abnormal movements: None, normal, Dental Status Current problems with teeth and/or dentures?: No Does patient usually wear dentures?: No  CIWA:    COWS:     Musculoskeletal: Strength & Muscle Tone: within normal limits Gait & Station: normal Patient leans: N/A  Psychiatric Specialty Exam: Physical Exam  Nursing note and vitals reviewed. Constitutional: He is oriented to person, place, and time. He appears well-developed and well-nourished.  HENT:  Head: Normocephalic and atraumatic.  Respiratory: Effort normal.  Neurological: He is alert  and oriented to person, place, and time.    ROS  Blood pressure 133/89, pulse 65, temperature 97.7 F (36.5 C), temperature source Oral, resp. rate 20, height 6\' 1"  (1.854 m), weight 89.4 kg (197 lb), SpO2 100 %.Body mass index is 25.99 kg/m.  General Appearance: Casual  Eye Contact:  Fair  Speech:  Normal Rate  Volume:  Decreased  Mood:  Anxious and Depressed  Affect:  Congruent  Thought Process:  Coherent  Orientation:  Full (Time, Place, and Person)  Thought Content:  Logical  Suicidal  Thoughts:  No  Homicidal Thoughts:  No  Memory:  Immediate;   Fair Recent;   Fair Remote;   Fair  Judgement:  Intact  Insight:  Lacking  Psychomotor Activity:  Increased  Concentration:  Concentration: Fair and Attention Span: Fair  Recall:  [employer]  Fund of Knowledge:  Fair  Language:  Fair  Akathisia:  Negative  Handed:  Right  AIMS (if indicated):     Assets:  Desire for Improvement Physical Health Resilience Talents/Skills  ADL's:  Intact  Cognition:  WNL  Sleep:  Number of Hours: 6.75     Treatment Plan Summary: Daily contact with patient to assess and evaluate symptoms and progress in treatment, Medication management and Plan : Patient is seen and examined.  Patient is a 22 year old male with the above-stated past psychiatric history seen in follow-up.  He is not doing as well today as yesterday.  He is a little bit more decompensated with his friend gone.  He is now worried about his job loss, his housing, and what is going to do next.  He has been avoiding groups today because "I have always been a leader".  I have asked him to take a step back, and to work on his coping skills.  I am going to increase his Prozac to 20 mg p.o. daily.  Hopefully that will be of some benefit for him.  Antonieta PertGreg Lawson Zhion Pevehouse, MD 05/04/2018, 3:02 PM

## 2018-05-04 NOTE — BHH Group Notes (Signed)
LCSW Group Therapy Note  05/04/2018   10:00--11:00am   Type of Therapy and Topic:  Group Therapy: Anger Cues and Responses  Participation Level:  Active   Description of Group:   In this group, patients learned how to recognize the physical, cognitive, emotional, and behavioral responses they have to anger-provoking situations.  They identified a recent time they became angry and how they reacted.  They analyzed how their reaction was possibly beneficial and how it was possibly unhelpful.  The group discussed a variety of healthier coping skills that could help with such a situation in the future.  Deep breathing was practiced briefly.  Therapeutic Goals: 1. Patients will remember their last incident of anger and how they felt emotionally and physically, what their thoughts were at the time, and how they behaved. 2. Patients will identify how their behavior at that time worked for them, as well as how it worked against them. 3. Patients will explore possible new behaviors to use in future anger situations. 4. Patients will learn that anger itself is normal and cannot be eliminated, and that healthier reactions can assist with resolving conflict rather than worsening situations.  Summary of Patient Progress:  The patient shared that his most recent anger was early in the day when was angry with a tech. He was able to remove himself from the situation and regroup. He expressed understanding of the physical and emotional cues associated with anger. He recognizes that anger is a natural human response and will work to maintain a positive outlook going forward.  Therapeutic Modalities:   Cognitive Behavioral Therapy  Evorn Gongonnie D. Priyansh Pry LCSW

## 2018-05-04 NOTE — Progress Notes (Signed)
D: Katherine denied SI, HI, and AVH this a.m. He says he just had a "breakdown" due to the stress of handling adult matters and responsibilities. 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 He has been present and interactive in the milieu. He denied any issues or concerns with his medications.   A: Meds given as ordered. Q15 safety checks maintained. Support/encouragement offered.  R: Pt remains free from harm and continues with treatment. Will continue to monitor for needs/safety.

## 2018-05-04 NOTE — Plan of Care (Signed)
  Problem: Activity: Goal: Sleeping patterns will improve Outcome: Progressing Note:  Pt slept 6.75 hours last night per flowsheet.    

## 2018-05-04 NOTE — Progress Notes (Signed)
Patient ID: Brandon PlummerJahru S Cortez, male   DOB: 10/10/1995, 22 y.o.   MRN: [id number] DAR Note: Pt observed in the dayroom this evening interacting with peer. Pt at the time of assessment denied all. States, "I feel much better today; I don't feel any anxiety or depression or nothing." All patient's questions and concerns addressed. Support, encouragement, and safe environment provided. 15-minute safety checks continue. Safety checks continue. Pt attended wrap-up group.

## 2018-05-04 NOTE — Progress Notes (Signed)
D: Pt was in dayroom upon initial approach.  Pt presents with appropriate affect and mood.  He smiles with interaction.  Pt describes his day as "all right" and reports his goal is to "try to think positive."  Pt discussed how he was upset he missed his first football game today and reports having a good visit with his mother stating "she calmed me down a little bit."  Pt denies SI/HI, denies hallucinations, denies pain.  Pt has been visible in milieu interacting with peers and staff appropriately.  Pt attended evening group.    A: Introduced self to pt.  Actively listened to pt and offered support and encouragement. Q15 minute safety checks maintained.  R: Pt is safe on the unit.  Pt verbally contracts for safety.  Will continue to monitor and assess.

## 2018-05-04 NOTE — Progress Notes (Signed)
Adult Psychoeducational Group Note  Date:  05/04/2018 Time:  3:12 AM  Group Topic/Focus:  Wrap-Up Group:   The focus of this group is to help patients review their daily goal of treatment and discuss progress on daily workbooks.  Participation Level:  Active  Participation Quality:  Appropriate  Affect:  Appropriate  Cognitive:  Alert  Insight: Appropriate  Engagement in Group:  Engaged  Modes of Intervention:  Discussion  Additional Comments:  Pt stated visit with his son made his day almost great.  Pt rated the day at 9/10.  Otha Monical 05/04/2018, 3:12 AM

## 2018-05-04 NOTE — [facility] Group Notes (Signed)
[facility] Group Notes:  (Nursing/MHT/Case Management/Adjunct)  Date:  05/04/2018  Time:  2:36 PM  Type of Therapy:  Psychoeducational Skills  Participation Level:  Did Not Attend  Participation Quality:  Did not attend  Affect:  Did not attend  Cognitive:  Did not attend  Insight:  None  Engagement in Group:  Did not attend  Modes of Intervention:  Did not attend  Summary of Progress/Problems: Pt did not attend Psychoeducational group with topic anger management.   Jacquelyne BalintForrest, Jerauld Bostwick Shanta 05/04/2018, 2:36 PM

## 2018-05-05 MED ORDER — FLUOXETINE HCL 20 MG PO CAPS: 20.0000 mg | ORAL_CAPSULE | Freq: Every day | ORAL | 0 refills | Status: AC

## 2018-05-05 MED ORDER — TRAZODONE HCL 50 MG PO TABS: 50.0000 mg | ORAL_TABLET | Freq: Every evening | ORAL | 0 refills | Status: AC | PRN

## 2018-05-05 NOTE — Discharge Summary (Signed)
Physician Discharge Summary Note  Patient:  Brandon Cortez is an 22 y.o., male MRN:  [id number] DOB:  1996/07/21 Patient phone:  [phone] (home)  Patient address:   [address] [place] [address],  Total Time spent with patient: 20 minutes  Date of Admission:  05/02/2018 Date of Discharge: 05/05/2018  Reason for Admission: per assessment note:Patient is seen and examined.  Patient is a 22 year old male with a past psychiatric history significant for anxiety, panic attacks and depression who presented to the [facility] emergency department on 05/02/2018.  He was found in his house in the bathtub with water up to his neck, but at that time denied that he was trying to hurt himself.  Apparently he had sent a text to his mother stating "life is too much pain right now, I do not want it".  The mother called the police for safety check.  The police found him there and brought him to the hospital.  The patient stated that he had been under increasing recent psychosocial stressors.  He had moved from [place] to home with his girlfriend and their 54-year-old child.  It was several months ago.  It had not gone well.  The girlfriend moved with her child back to her mother and father's.  That had made him very upset.  He also had some trouble finding a job, but ended up getting one.  He stated he had been in the emergency room locally several times in the past with anxiety attacks and panic attacks.  He admitted that his mother had been admitted to the psychiatric hospital on several occasions.  He was seen in the emergency room and then transferred to our facility for evaluation and stabilization.  The patient admitted to crying spells, feeling helpless, hopeless and worthless.  He denied any current suicidality.  He did admit to racing thoughts, anxiety and poor sleep.  He denied any previous episodes of euphoria or excessive spending.  No real increase in goal-directed activity.    Principal Problem:  MDD (major depressive disorder), single episode, severe [facility]) Discharge Diagnoses: Patient Active Problem List   Diagnosis Date Noted  . MDD (major depressive disorder), single episode, severe (HCC) [F32.2] 05/02/2018    Past Psychiatric History: per assessment note:Patient denied any previous psychiatric admissions, psychiatric medications long-term, or psychiatric treatment.    Past Medical History:  Past Medical History:  Diagnosis Date  . Medical history non-contributory    History reviewed. No pertinent surgical history. Family History: History reviewed. No pertinent family history. Family Psychiatric  History:  Social History: Social History   Substance and Sexual Activity  Alcohol Use No     Social History   Substance and Sexual Activity  Drug Use Yes  . Types: Marijuana    Social History   Socioeconomic History  . Marital status: Single    Spouse name: Not on file  . Number of children: Not on file  . Years of education: Not on file  . Highest education level: Not on file  Occupational History  . Not on file  Social Needs  . Financial resource strain: Not on file  . Food insecurity:    Worry: Not on file    Inability: Not on file  . Transportation needs:    Medical: Not on file    Non-medical: Not on file  Tobacco Use  . Smoking status: Never Smoker  . Smokeless tobacco: Never Used  Substance and Sexual Activity  . Alcohol use: No  .  Drug use: Yes    Types: Marijuana  . Sexual activity: Yes  Lifestyle  . Physical activity:    Days per week: Not on file    Minutes per session: Not on file  . Stress: Not on file  Relationships  . Social connections:    Talks on phone: Not on file    Gets together: Not on file    Attends religious service: Not on file    Active member of club or organization: Not on file    Attends meetings of clubs or organizations: Not on file    Relationship status: Not on file  Other Topics Concern  . Not on file  Social  History Narrative  . Not on file    Hospital Course:  Demarie S Leete was admitted for MDD (major depressive disorder), single episode, severe (HCC)  and crisis management.  Pt was treated discharged with the medications listed below under Medication List.  Medical problems were identified and treated as needed.  Home medications were restarted as appropriate.  Improvement was monitored by observation and Willis S Shugart 's daily report of symptom reduction.  Emotional and mental status was monitored by daily self-inventory reports completed by Kateri Plummer and clinical staff.         Mando S Tibbits was evaluated by the treatment team for stability and plans for continued recovery upon discharge. Algie S Lawry 's motivation was an integral factor for scheduling further treatment. Employment, transportation, bed availability, health status, family support, and any pending legal issues were also considered during hospital stay. Pt was offered further treatment options upon discharge including but not limited to Residential, Intensive Outpatient, and Outpatient treatment.  Severiano S Poppen will follow up with the services as listed below under Follow Up Information.     Upon completion of this admission the patient was both mentally and medically stable for discharge denying suicidal/homicidal ideation, auditory/visual/tactile hallucinations, delusional thoughts and paranoia.    Mehtaab S Mcluckie responded well to treatment with Prozac 20 mg and Trazodone 50mg  without adverse effects. Pt demonstrated improvement without reported or observed adverse effects to the point of stability appropriate for outpatient management. Pertinent labs include: TCH, for which outpatient follow-up is necessary for lab recheck as mentioned below. Reviewed CBC, CMP, BAL, and UDS; all unremarkable aside from noted exceptions.   Physical Findings: AIMS: Facial and Oral Movements Muscles of Facial Expression: None, normal Lips  and Perioral Area: None, normal Jaw: None, normal Tongue: None, normal,Extremity Movements Upper (arms, wrists, hands, fingers): None, normal Lower (legs, knees, ankles, toes): None, normal, Trunk Movements Neck, shoulders, hips: None, normal, Overall Severity Severity of abnormal movements (highest score from questions above): None, normal Incapacitation due to abnormal movements: None, normal, Dental Status Current problems with teeth and/or dentures?: No Does patient usually wear dentures?: No  CIWA:    COWS:     Musculoskeletal: Strength & Muscle Tone: within normal limits Gait & Station: normal Patient leans: N/A  Psychiatric Specialty Exam: See SRA by MD Physical Exam  Vitals reviewed. Constitutional: He appears well-developed.  HENT:  Head: Normocephalic.  Psychiatric: He has a normal mood and affect. His behavior is normal.    Review of Systems  Psychiatric/Behavioral: Negative for depression (stable) and substance abuse. The patient is not nervous/anxious (stable).   All other systems reviewed and are negative.   Blood pressure 132/87, pulse (!) 54, temperature 98 F (36.7 C), temperature source Oral, resp. rate 20, height 6\' 1"  (1.854  m), weight 89.4 kg (197 lb), SpO2 100 %.Body mass index is 25.99 kg/m.  Have you used any form of tobacco in the last 30 days? (Cigarettes, Smokeless Tobacco, Cigars, and/or Pipes): No  Has this patient used any form of tobacco in the last 30 days? (Cigarettes, Smokeless Tobacco, Cigars, and/or Pipes)  Yes, A prescription for an FDA-approved tobacco cessation medication was offered at discharge and the patient refused  Blood Alcohol level:  Lab Results  Component Value Date   ETH <10 05/02/2018    Metabolic Disorder Labs:  No results found for: HGBA1C, MPG No results found for: PROLACTIN No results found for: CHOL, TRIG, HDL, CHOLHDL, VLDL, LDLCALC  See Psychiatric Specialty Exam and Suicide Risk Assessment completed by Attending  Physician prior to discharge.  Discharge destination:  Home  Is patient on multiple antipsychotic therapies at discharge:  No   Has Patient had three or more failed trials of antipsychotic monotherapy by history:  No  Recommended Plan for Multiple Antipsychotic Therapies: NA  Discharge Instructions    Diet - low sodium heart healthy   Complete by:  As directed    Discharge instructions   Complete by:  As directed    Take all medications as prescribed. Keep all follow-up appointments as scheduled.  Do not consume alcohol or use illegal drugs while on prescription medications. Report any adverse effects from your medications to your primary care provider promptly.  In the event of recurrent symptoms or worsening symptoms, call 911, a crisis hotline, or go to the nearest emergency department for evaluation.   Increase activity slowly   Complete by:  As directed      Allergies as of 05/05/2018   No Known Allergies     Medication List    STOP taking these medications   ondansetron 4 MG disintegrating tablet Commonly known as:  ZOFRAN ODT     TAKE these medications     Indication  FLUoxetine 20 MG capsule Commonly known as:  PROZAC Take 1 capsule (20 mg total) by mouth daily. Start taking on:  05/06/2018  Indication:  Depressive Phase of Manic-Depression   traZODone 50 MG tablet Commonly known as:  DESYREL Take 1 tablet (50 mg total) by mouth at bedtime as needed and may repeat dose one time if needed for sleep.  Indication:  Trouble Sleeping        Follow-up recommendations:  Activity:  as tolerated Diet:  heart healthy  Comments:  Take all medications as prescribed. Keep all follow-up appointments as scheduled.  Do not consume alcohol or use illegal drugs while on prescription medications. Report any adverse effects from your medications to your primary care provider promptly.  In the event of recurrent symptoms or worsening symptoms, call 911, a crisis hotline, or  go to the nearest emergency department for evaluation.   Signed: Oneta Rackanika N Trigger Frasier, NP 05/06/2018, 7:59 AM

## 2018-05-05 NOTE — Plan of Care (Signed)
  Problem: Education: Goal: Emotional status will improve Outcome: Progressing   

## 2018-05-05 NOTE — Progress Notes (Signed)
D: Pt was in dayroom with visitors upon initial approach.  Pt presents with appropriate affect and mood.  His goal is to "keep myself together."  He reports he is discharging tomorrow and he feels safe to do so.  Pt denies SI/HI, denies hallucinations, denies pain.  Pt has been visible in milieu interacting with peers and staff appropriately.  Pt attended evening group.    A: Introduced self to pt.  Actively listened to pt and offered support and encouragement. PRN medication administered for sleep.  Q15 minute safety checks maintained.  R: Pt is safe on the unit.  Pt is compliant with medications.  Pt verbally contracts for safety.  Will continue to monitor and assess.

## 2018-05-05 NOTE — [facility] Group Notes (Signed)
[facility] Group Notes:  (Nursing/MHT/Case Management/Adjunct)  Date:  05/05/2018  Time:  1:49 PM  Type of Therapy:  Psychoeducational Skills  Participation Level:  Did Not Attend  Participation Quality:  Did not attend  Affect:  Did not attend  Cognitive:  Did not attend  Insight:  None  Engagement in Group:  Did not attend  Modes of Intervention:  Did not attend  Summary of Progress/Problems: Pt did not attend Psychoeducational group with topic healthy support systems.   Jacquelyne BalintForrest, Justyn Boyson Shanta 05/05/2018, 1:49 PM

## 2018-05-05 NOTE — Progress Notes (Signed)
D Pt is observed UAL on the 400 hall today. He smiles. He is cooperative . He says " I got really upset yesterday when my friend went home but I'm doing better today". He interacts appropriately with his peers and the staff. He makes good eye contact.    A HE completed his daily assessment and on this he wrote he denied SI today and he rated his depression, hopelessness and anxeity " 0/0/0/", respectively.     R Safety is in place.

## 2018-05-05 NOTE — Progress Notes (Signed)
[facility]  05/05/2018 12:05 PM Kateri PlummerJahru S Diantonio  MRN:  [id number] Subjective: Patient is seen and examined.  Patient is a 22 year old male with a past psychiatric history significant for generalized anxiety, panic attacks and major depression.  He seen in follow-up.  He is doing better today.  He found out from his girlfriend that he will be able to go back to the house he was staying in.  That is taken a lot of stress over him.  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  He denied any suicidal ideation today.  He is able to smile and engage today.  No side effects noted from his medications. Principal Problem: MDD (major depressive disorder), single episode, severe (HCC) Diagnosis:   Patient Active Problem List   Diagnosis Date Noted  . MDD (major depressive disorder), single episode, severe (HCC) [F32.2] 05/02/2018   Total Time spent with patient: 15 minutes  Past Psychiatric History: See admission H&P  Past Medical History:  Past Medical History:  Diagnosis Date  . Medical history non-contributory    History reviewed. No pertinent surgical history. Family History: History reviewed. No pertinent family history. Family Psychiatric  History: See admission H&P Social History:  Social History   Substance and Sexual Activity  Alcohol Use No     Social History   Substance and Sexual Activity  Drug Use Yes  . Types: Marijuana    Social History   Socioeconomic History  . Marital status: Single    Spouse name: Not on file  . Number of children: Not on file  . Years of education: Not on file  . Highest education level: Not on file  Occupational History  . Not on file  Social Needs  . Financial resource strain: Not on file  . Food insecurity:    Worry: Not on file    Inability: Not on file  . Transportation needs:    Medical: Not on file    Non-medical: Not on file  Tobacco Use  . Smoking status: Never Smoker  . Smokeless  tobacco: Never Used  Substance and Sexual Activity  . Alcohol use: No  . Drug use: Yes    Types: Marijuana  . Sexual activity: Yes  Lifestyle  . Physical activity:    Days per week: Not on file    Minutes per session: Not on file  . Stress: Not on file  Relationships  . Social connections:    Talks on phone: Not on file    Gets together: Not on file    Attends religious service: Not on file    Active member of club or organization: Not on file    Attends meetings of clubs or organizations: Not on file    Relationship status: Not on file  Other Topics Concern  . Not on file  Social History Narrative  . Not on file   Additional Social History:      Name of Substance 1: THC  1 - Age of First Use: 9th grade 1 - Amount (size/oz): Smokes twice a day.  "Controls my anger." 1 - Frequency: daily 1 - Duration: ongoing 1 - Last Use / Amount: yesterday                  Sleep: Good  Appetite:  Good  Current Medications: Current Facility-Administered Medications  Medication Dose Route Frequency Provider Last Rate Last Dose  . acetaminophen (TYLENOL) tablet 650  mg  650 mg Oral Q6H PRN Mariel Craft, MD      . alum & mag hydroxide-simeth (MAALOX/MYLANTA) 200-200-20 MG/5ML suspension 30 mL  30 mL Oral Q4H PRN Mariel Craft, MD      . diphenhydrAMINE (BENADRYL) capsule 25 mg  25 mg Oral Q6H PRN Mariel Craft, MD      . FLUoxetine (PROZAC) capsule 20 mg  20 mg Oral Daily [name], MD   20 mg at 05/05/18 0758  . hydrOXYzine (ATARAX/VISTARIL) tablet 25 mg  25 mg Oral TID PRN Mariel Craft, MD   25 mg at 05/02/18 2151  . magnesium hydroxide (MILK OF MAGNESIA) suspension 30 mL  30 mL Oral Daily PRN Mariel Craft, MD      . traZODone (DESYREL) tablet 50 mg  50 mg Oral QHS PRN,MR X 1 Mariel Craft, MD   50 mg at 05/04/18 2155    Lab Results: No results found for this or any previous visit (from the past 48 hour(s)).  Blood Alcohol level:  Lab Results   Component Value Date   ETH <10 05/02/2018    Metabolic Disorder Labs: No results found for: HGBA1C, MPG No results found for: PROLACTIN No results found for: CHOL, TRIG, HDL, CHOLHDL, VLDL, LDLCALC  Physical Findings: AIMS: Facial and Oral Movements Muscles of Facial Expression: None, normal Lips and Perioral Area: None, normal Jaw: None, normal Tongue: None, normal,Extremity Movements Upper (arms, wrists, hands, fingers): None, normal Lower (legs, knees, ankles, toes): None, normal, Trunk Movements Neck, shoulders, hips: None, normal, Overall Severity Severity of abnormal movements (highest score from questions above): None, normal Incapacitation due to abnormal movements: None, normal, Dental Status Current problems with teeth and/or dentures?: No Does patient usually wear dentures?: No  CIWA:    COWS:     Musculoskeletal: Strength & Muscle Tone: within normal limits Gait & Station: normal Patient leans: N/A  Psychiatric Specialty Exam: Physical Exam  Nursing note and vitals reviewed. Constitutional: He is oriented to person, place, and time. He appears well-developed and well-nourished.  HENT:  Head: Normocephalic and atraumatic.  Respiratory: Effort normal.  Neurological: He is alert and oriented to person, place, and time.    ROS  Blood pressure 132/87, pulse (!) 54, temperature 98 F (36.7 C), temperature source Oral, resp. rate 20, height 6\' 1"  (1.854 m), weight 89.4 kg (197 lb), SpO2 100 %.Body mass index is 25.99 kg/m.  General Appearance: Casual  Eye Contact:  Good  Speech:  Clear and Coherent  Volume:  Normal  Mood:  Euthymic  Affect:  Congruent  Thought Process:  Coherent  Orientation:  Full (Time, Place, and Person)  Thought Content:  Logical  Suicidal Thoughts:  No  Homicidal Thoughts:  No  Memory:  Immediate;   Fair Recent;   Fair Remote;   Fair  Judgement:  Intact  Insight:  Fair  Psychomotor Activity:  Normal  Concentration:   Concentration: Fair and Attention Span: Fair  Recall:  [employer] of Knowledge:  Fair  Language:  Fair  Akathisia:  Negative  Handed:  Right  AIMS (if indicated):     Assets:  Communication Skills Desire for Improvement Housing Physical Health Resilience Social Support  ADL's:  Intact  Cognition:  WNL  Sleep:  Number of Hours: 6.75     Treatment Plan Summary: Daily contact with patient to assess and evaluate symptoms and progress in treatment, Medication management and Plan : Patient is seen and examined.  Patient is a 22 year old male with a past psychiatric history significant for generalized anxiety disorder, panic disorder and major depression.  He is seen in follow-up.  He is doing better today.  His anxiety is decreased.  Some of his anxiety about housing it been taking care of.  I will contact social work to get in contact with the girlfriend to confirm this.  If he continues to do well overnight tonight we will consider getting him out of the hospital tomorrow.  He denied any side effects to his medications.  No suicidal or homicidal ideation.  [name], MD 05/05/2018, 12:05 PM

## 2018-05-05 NOTE — [facility] Group Notes (Signed)
[facility] LCSW Group Therapy Note  Date/Time:  05/05/2018 9:00-10:00-11:00AM  Type of Therapy and Topic:  Group Therapy:  Healthy and Unhealthy Supports  Participation Level:  Active   Description of Group:  Patients in this group were introduced to the idea of adding a variety of healthy supports to address the various needs in their lives.Patients discussed what additional healthy supports could be helpful in their recovery and wellness after discharge in order to prevent future hospitalizations.   An emphasis was placed on using counselor, doctor, therapy groups, 12-step groups, and problem-specific support groups to expand supports.  They also worked as a group on developing a specific plan for several patients to deal with unhealthy supports through boundary-setting, psychoeducation with loved ones, and even termination of relationships.   Therapeutic Goals:   1)  discuss importance of adding supports to stay well once out of the hospital  2)  compare healthy versus unhealthy supports and identify some examples of each  3)  generate ideas and descriptions of healthy supports that can be added  4)  offer mutual support about how to address unhealthy supports  5)  encourage active participation in and adherence to discharge plan    Summary of Patient Progress:  The patient stated that current healthy supports in his life is his mother while current unhealthy supports include friends who have on occasion put in situations with potential for outcomes  The patient expressed a willingness to be more assertive and to shed negative influences out of his life.   Therapeutic Modalities:   Motivational Interviewing Brief Solution-Focused Therapy  Evorn Gongonnie D Yoel Kaufhold

## 2018-05-06 NOTE — [facility] Suicide Risk Assessment (Signed)
[facility] INPATIENT:  Family/Significant Other Suicide Prevention Education  Suicide Prevention Education:  Education Completed; Ballard Russellonekia Turner, mother, [phone], has been identified by the patient as the family member/significant other with whom the patient will be residing, and identified as the person(s) who will aid the patient in the event of a mental health crisis (suicidal ideations/suicide attempt).  With written consent from the patient, the family member/significant other has been provided the following suicide prevention education, prior to the and/or following the discharge of the patient.  The suicide prevention education provided includes the following:  Suicide risk factors  Suicide prevention and interventions  National Suicide Hotline telephone number  [facility] assessment telephone number  [facility] Emergency Assistance 911  [facility] and/or Residential Mobile Crisis Unit telephone number  Request made of family/significant other to:  Remove weapons (e.g., guns, rifles, knives), all items previously/currently identified as safety concern.  Pt does have gun.  Gun safety discussed.  Remove drugs/medications (over-the-counter, prescriptions, illicit drugs), all items previously/currently identified as a safety concern.  The family member/significant other verbalizes understanding of the suicide prevention education information provided.  The family member/significant other agrees to remove the items of safety concern listed above.  Lorri FrederickWierda, Shanetta Nicolls Jon, LCSW 05/06/2018, 10:03 AM

## 2018-05-06 NOTE — Progress Notes (Signed)
  [facility] Dis[facility] :  Will you be returning to the same living situation after discharge:  No. Pt will be staying with his mother after discharge. At discharge, do you have transportation home?: Yes,  parent Do you have the ability to pay for your medications: Yes,  BC[employer]  Release of information consent forms completed and in the chart;  Patient's signature needed at discharge.  Patient to Follow up at: Follow-up Information    [facility], [facility]. Go on 05/08/2018.   Why:  Please attend your intake appt on Wednesday, 05/08/18, at 8:30am.  Please bring photo ID, social security card, insurance card, and proof of household income. Contact information: [address] [place] [id number] [phone]           Next level of care provider has access to [facility] Link:no  Safety Planning and Suicide Prevention discussed: Yes,  with mother  Have you used any form of tobacco in the last 30 days? (Cigarettes, Smokeless Tobacco, Cigars, and/or Pipes): No  Has patient been referred to the Quitline?: N/A patient is not a smoker  Patient has been referred for addiction treatment: Yes  Lorri FrederickWierda, Fuquan Wilson Jon, LCSW 05/06/2018, 11:11 AM

## 2018-05-06 NOTE — Progress Notes (Signed)
D: Pt A & O X 4. Denies SI, HI, AVH and pain at this time. Endorsed anxiety on assessment 4/10 "I'm just anxious about going home, nothing major". Rates his depression and hopelessness 0/10.Present anxious and animated on approach. Pt D/C home as ordered. Picked up in lobby by his mother.  A: D/C instructions reviewed with pt including prescriptions, medication samples and follow up appointment; compliance encouraged. All belongings from locker # 35 given to pt at time of departure. Scheduled medications given with verbal education and effects monitored. Safety checks maintained without incident till time of d/c.  R: Pt receptive to care. Compliant with medications when offered. Denies adverse drug reactions when assessed. Verbalized understanding related to d/c instructions. Signed belonging sheet in agreement with items received from locker. Ambulatory with a steady gait. Appears to be in no physical distress at time of departure.

## 2018-05-06 NOTE — [facility] Suicide Risk Assessment (Signed)
[facility]   Principal Problem: MDD (major depressive disorder), single episode, severe [facility](HCC) Discharge Diagnoses:  Patient Active Problem List   Diagnosis Date Noted  . MDD (major depressive disorder), single episode, severe (HCC) [F32.2] 05/02/2018    Total Time spent with patient: 15 minutes  Musculoskeletal: Strength & Muscle Tone: within normal limits Gait & Station: normal Patient leans: N/A  Psychiatric Specialty Exam: Review of Systems  All other systems reviewed and are negative.   Blood pressure 133/75, pulse 62, temperature 97.6 F (36.4 C), temperature source Oral, resp. rate (!) 24, height 6\' 1"  (1.854 m), weight 89.4 kg (197 lb), SpO2 100 %.Body mass index is 25.99 kg/m.  General Appearance: Casual  Eye Contact::  Fair  Speech:  Normal Rate409  Volume:  Normal  Mood:  Euthymic  Affect:  Congruent  Thought Process:  Coherent  Orientation:  Full (Time, Place, and Person)  Thought Content:  Logical  Suicidal Thoughts:  No  Homicidal Thoughts:  No  Memory:  Immediate;   Fair Recent;   Fair Remote;   Fair  Judgement:  Intact  Insight:  Fair  Psychomotor Activity:  Normal  Concentration:  Good  Recall:  Good  Fund of Knowledge:Good  Language: Good  Akathisia:  Negative  Handed:  Right  AIMS (if indicated):     Assets:  Communication Skills Desire for Improvement Housing Leisure Time Physical Health Resilience  Sleep:  Number of Hours: 6.75  Cognition: WNL  ADL's:  Intact   Mental Status Per Nursing Assessment::   On Admission:  Suicide plan  Demographic Factors:  Male, Adolescent or young adult, Low socioeconomic status, Living alone and Unemployed  Loss Factors: NA  Historical Factors: Impulsivity  Risk Reduction Factors:   Sense of responsibility to family  Continued Clinical Symptoms:  Depression:   Impulsivity  Cognitive Features That Contribute To Risk:  None    Suicide Risk:  Minimal: No  identifiable suicidal ideation.  Patients presenting with no risk factors but with morbid ruminations; may be classified as minimal risk based on the severity of the depressive symptoms    Plan Of Care/Follow-up recommendations:  Activity:  ad lib  Antonieta PertGreg Lawson Aleana Fifita, MD 05/06/2018, 7:52 AM

## 2018-05-06 NOTE — [facility] Suicide Risk Assessment (Signed)
[facility] INPATIENT:  Family/Significant Other Suicide Prevention Education  Suicide Prevention Education:  Contact Attempts: Brandon Cortez, mother, [phone], has been identified by the patient as the family member/significant other with whom the patient will be residing, and identified as the person(s) who will aid the patient in the event of a mental health crisis.  With written consent from the patient, two attempts were made to provide suicide prevention education, prior to and/or following the patient's discharge.  We were unsuccessful in providing suicide prevention education.  A suicide education pamphlet was given to the patient to share with family/significant other.  Date and time of first attempt:05/06/18, [id number] Date and time of second attempt:  Brandon Cortez, Brandon Frary Jon, LCSW 05/06/2018, 8:32 AM

## 2018-05-06 NOTE — Progress Notes (Signed)
Recreation Therapy Notes  Date: 8.5.19 Time: 0930 Location: [address] Dayroom  Group Topic: Stress Management  Goal Area(s) Addresses:  Patient will verbalize importance of using healthy stress management.  Patient will identify positive emotions associated with healthy stress management.   Intervention: Stress Management  Activity : Guided Imagery.  LRT introduced the stress management technique of guided imagery.  LRT read a script to lead patients on a journey through a meadow.  Patients were to follow along as script was read.  Education: Stress Management, Discharge Planning.   Education Outcome: Acknowledges edcuation/In group clarification offered/Needs additional education  Clinical Observations/Feedback: Pt did not attend group.    Caroll RancherMarjette Lita Flynn, LRT/CTRS         Caroll RancherLindsay, Naseer Hearn A 05/06/2018 11:34 AM

## 2019-06-15 ENCOUNTER — Emergency Department (HOSPITAL_COMMUNITY): Payer: BC Managed Care – PPO

## 2019-06-15 ENCOUNTER — Emergency Department (HOSPITAL_COMMUNITY)
Admission: EM | Admit: 2019-06-15 | Discharge: 2019-06-15 | Disposition: A | Discharge status: Home / Self Care | Payer: BC Managed Care – PPO | Attending: Emergency Medicine | Admitting: Emergency Medicine

## 2019-06-15 ENCOUNTER — Other Ambulatory Visit: Payer: Self-pay

## 2019-06-15 ENCOUNTER — Encounter (HOSPITAL_COMMUNITY): Payer: Self-pay | Admitting: Emergency Medicine

## 2019-06-15 DIAGNOSIS — Z79899 Other long term (current) drug therapy: Secondary | ICD-10-CM | POA: Insufficient documentation

## 2019-06-15 DIAGNOSIS — M25512 Pain in left shoulder: Secondary | ICD-10-CM

## 2019-06-15 NOTE — ED Notes (Signed)
To rad 

## 2019-06-15 NOTE — ED Provider Notes (Addendum)
[facility] EMERGENCY DEPARTMENT Provider Note   CSN: [id number] Arrival date & time: 06/15/19  1100     History   Chief Complaint Chief Complaint  Patient presents with  . Shoulder Pain    HPI Brandon Cortez is a 23 y.o. male presents today for left shoulder pain that began after playing football last night.  Patient reports that he was tackled landing onto his left shoulder.  Patient reports that he felt well after the tackle but after the game noticed he had some pain to his left shoulder while taking off of his pads.  He describes a moderate aching sensation to his left shoulder only when raising the arm laterally above shoulder level, pain improves with rest and does not radiate.  Patient denies head injury, loss consciousness, neck pain/stiffness, chest pain/shortness of breath, abdominal pain, numbness/weakness, tingling of the extremities or any additional concerns today.     HPI  Past Medical History:  Diagnosis Date  . Medical history non-contributory     Patient Active Problem List   Diagnosis Date Noted  . MDD (major depressive disorder), single episode, severe (HCC) 05/02/2018    History reviewed. No pertinent surgical history.      Home Medications    Prior to Admission medications   Medication Sig Start Date End Date Taking? Authorizing Provider  FLUoxetine (PROZAC) 20 MG capsule Take 1 capsule (20 mg total) by mouth daily. 05/06/18   Oneta Rack, NP  traZODone (DESYREL) 50 MG tablet Take 1 tablet (50 mg total) by mouth at bedtime as needed and may repeat dose one time if needed for sleep. 05/05/18   Oneta Rack, NP    Family History History reviewed. No pertinent family history.  Social History Social History   Tobacco Use  . Smoking status: Never Smoker  . Smokeless tobacco: Never Used  Substance Use Topics  . Alcohol use: No  . Drug use: Yes    Types: Marijuana     Allergies   Patient has no known allergies.   Review of Systems  Review of Systems Ten systems are reviewed and are negative for acute change except as noted in the HPI   Physical Exam Updated Vital Signs BP (!) 144/85 (BP Location: Right Arm)   Pulse (!) 49   Temp 98 F (36.7 C) (Oral)   Resp 16   Ht 6\' 2"  (1.88 m)   Wt 102.1 kg   SpO2 100%   BMI 28.89 kg/m   Physical Exam Constitutional:      General: He is not in acute distress.    Appearance: Normal appearance. He is well-developed. He is not ill-appearing or diaphoretic.  HENT:     Head: Normocephalic and atraumatic.     Right Ear: External ear normal.     Left Ear: External ear normal.     Nose: Nose normal.  Eyes:     General: Vision grossly intact. Gaze aligned appropriately.     Pupils: Pupils are equal, round, and reactive to light.  Neck:     Musculoskeletal: Normal range of motion.     Trachea: Trachea and phonation normal. No tracheal deviation.  Cardiovascular:     Rate and Rhythm: Normal rate and regular rhythm.     Pulses:          Radial pulses are 2+ on the right side and 2+ on the left side.  Pulmonary:     Effort: Pulmonary effort is normal. No respiratory distress.  Abdominal:     General: There is no distension.     Palpations: Abdomen is soft.     Tenderness: There is no abdominal tenderness. There is no guarding or rebound.  Musculoskeletal: Normal range of motion.     Comments: Cervical Spine: Appearance normal. No obvious bony deformity. No skin swelling, erythema, heat, fluctuance or break of the skin. No TTP over the cervical spinous processes. No paraspinal tenderness. No step-offs. Patient is able to actively rotate their neck 45 degrees left and right voluntarily without pain and flex and extend the neck without pain.   Left Shoulder: Appearance normal. No obvious bony deformity. No skin swelling, erythema, heat, fluctuance or break of the skin. No clavicular deformity or TTP. No tenderness to palpation. Active and passive flexion and extension intact  with minimal pain.  Decreased range of motion due to pain with abduction/abduction and internal/external rotation.    Left Elbow: Appearance normal. No obvious bony deformity. No skin swelling, erythema, heat, fluctuance or break of the skin. No TTP over joint. Active flexion, extension, supination and pronation full and intact without pain. Strength able and appropriate for age for flexion and extension.  Radial Pulse 2+. Cap refill <2 seconds. SILT for M/U/R distributions. Compartments soft.   Skin:    General: Skin is warm and dry.  Neurological:     Mental Status: He is alert.     GCS: GCS eye subscore is 4. GCS verbal subscore is 5. GCS motor subscore is 6.     Comments: Speech is clear and goal oriented, follows commands Major Cranial nerves without deficit, no facial droop Strong and equal grip bilaterally Sensation normal to light and sharp touch Moves extremities without ataxia, coordination intact  Psychiatric:        Behavior: Behavior normal.    ED Treatments / Results  Labs (all labs ordered are listed, but only abnormal results are displayed) Labs Reviewed - No data to display  EKG None  Radiology Dg Shoulder Left  Result Date: 06/15/2019 CLINICAL DATA:  Left shoulder pain beginning yesterday after playing football. Denies direct injury. EXAM: LEFT SHOULDER - 2+ VIEW COMPARISON:  None. FINDINGS: There is no evidence of fracture or dislocation. There is no evidence of arthropathy or other focal bone abnormality. Soft tissues are unremarkable. IMPRESSION: Negative. Electronically Signed   By: Marin Olp M.D.   On: 06/15/2019 11:51    Procedures Procedures (including critical care time)  Medications Ordered in ED Medications - No data to display   Initial Impression / Assessment and Plan / ED Course  I have reviewed the triage vital signs and the nursing notes.  Pertinent labs & imaging results that were available during my care of the patient were reviewed by  me and considered in my medical decision making (see chart for details).     23 year old male presents today for left shoulder pain after falling during football game yesterday.  He has pain with range of motion above shoulder level, abduction/abduction and internal/external rotation.  No gross deformities on examination.  Neurovascularly intact to the extremity with strong and equal radial pulses, grip strength and motion of the elbow, wrist and neck.  No evidence of septic arthritis, cellulitis, DVT, compartment syndrome or other emergent pathologies at this time.  X-ray imaging today is negative.  There is concern for possible rotator cuff injury versus labral, patient has been placed in sling and encouraged to call orthopedic office today to schedule a follow-up appointment.  Rice  therapy and OTC anti-inflammatories discussed.  Patient advised of x-ray results and limitations of x-rays today, no indication for further work-up or imaging here in the emergency department.  At this time there does not appear to be any evidence of an acute emergency medical condition and the patient appears stable for discharge with appropriate outpatient follow up. Diagnosis was discussed with patient who verbalizes understanding of care plan and is agreeable to discharge. I have discussed return precautions with patient who verbalizes understanding of return precautions. Patient encouraged to follow-up with their PCP and ortho. All questions answered.  Note: Portions of this report may have been transcribed using voice recognition software. Every effort was made to ensure accuracy; however, inadvertent computerized transcription errors may still be present. Final Clinical Impressions(s) / ED Diagnoses   Final diagnoses:  Acute pain of left shoulder    ED Discharge Orders    None       Bill SalinasMorelli, Alhaji Mcneal A, PA-C 06/15/19 1236    Bill SalinasMorelli, Terie Lear A, PA-C 06/15/19 1236    Samuel JesterMcManus, Kathleen, DO 06/17/19 1105

## 2019-06-15 NOTE — ED Triage Notes (Signed)
Patient c/o left shoulder pain. Patient states shoulder pain that started yesterday after playing football. Denies any direct injury that he knows of. Per patient started hurting last night in which he applied ice. Patient woke with more pain. Per patient increased pain with movement and unable to lift arm.

## 2019-06-15 NOTE — Discharge Instructions (Addendum)
You have been diagnosed today with left shoulder pain.  At this time there does not appear to be the presence of an emergent medical condition, however there is always the potential for conditions to change. Please read and follow the below instructions.  Please return to the Emergency Department immediately for any new or worsening symptoms. Please be sure to follow up with your Primary Care Provider within one week regarding your visit today; please call their office to schedule an appointment even if you are feeling better for a follow-up visit. Your x-ray today did not show fracture or dislocation of the bones however it is likely that there is tendon/ligament or labral injury that will require orthopedic evaluation additionally there is a possibility of unseen fractures.  Please call Dr. Stann Mainland office on your discharge paperwork today to schedule a follow-up appointment for further evaluation and treatment. Please use a sling given to you today to protect your left shoulder, you may use over-the-counter Tylenol and ibuprofen as directed on the packaging to help with your symptoms.  Use rest, ice and elevation to help with your symptoms.  As we discussed over the next few days as your pain improves please use the range of motion exercises as is comfortable to avoid stiffness of the shoulder.  Do not do these exercises if they cause any pain.  Get help right away if: Your arm, hand, or fingers: Tingle. Are numb. Are swollen. Are painful. Turn white or blue. You have any new/concerning or worsening symptoms   Please read the additional information packets attached to your discharge summary.
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
## 2019-12-21 ENCOUNTER — Encounter (HOSPITAL_COMMUNITY): Payer: Self-pay | Admitting: Emergency Medicine

## 2019-12-21 ENCOUNTER — Other Ambulatory Visit: Payer: Self-pay

## 2019-12-21 ENCOUNTER — Emergency Department (HOSPITAL_COMMUNITY): Payer: BC Managed Care – PPO

## 2019-12-21 ENCOUNTER — Emergency Department (HOSPITAL_COMMUNITY)
Admission: EM | Admit: 2019-12-21 | Discharge: 2019-12-21 | Disposition: A | Discharge status: Home / Self Care | Payer: BC Managed Care – PPO | Attending: Emergency Medicine | Admitting: Emergency Medicine

## 2019-12-21 DIAGNOSIS — Z79899 Other long term (current) drug therapy: Secondary | ICD-10-CM | POA: Insufficient documentation

## 2019-12-21 DIAGNOSIS — S199XXA Unspecified injury of neck, initial encounter: Secondary | ICD-10-CM | POA: Diagnosis present

## 2019-12-21 DIAGNOSIS — S40012A Contusion of left shoulder, initial encounter: Secondary | ICD-10-CM | POA: Diagnosis not present

## 2019-12-21 DIAGNOSIS — Y999 Unspecified external cause status: Secondary | ICD-10-CM | POA: Insufficient documentation

## 2019-12-21 DIAGNOSIS — Y93I9 Activity, other involving external motion: Secondary | ICD-10-CM | POA: Diagnosis not present

## 2019-12-21 DIAGNOSIS — Y92410 Unspecified street and highway as the place of occurrence of the external cause: Secondary | ICD-10-CM | POA: Diagnosis not present

## 2019-12-21 DIAGNOSIS — S161XXA Strain of muscle, fascia and tendon at neck level, initial encounter: Secondary | ICD-10-CM

## 2019-12-21 MED ORDER — IBUPROFEN 800 MG PO TABS: 800.0000 mg | ORAL_TABLET | Freq: Four times a day (QID) | ORAL | 0 refills | Status: AC | PRN

## 2019-12-21 MED ORDER — IBUPROFEN 800 MG PO TABS
800.0000 mg | ORAL_TABLET | Freq: Once | ORAL | Status: AC
  Administered 2019-12-21: 800 mg via ORAL
  Filled 2019-12-21: qty 1

## 2019-12-21 MED ORDER — METHOCARBAMOL 500 MG PO TABS: 500.0000 mg | ORAL_TABLET | Freq: Three times a day (TID) | ORAL | 0 refills | Status: AC | PRN

## 2019-12-21 MED ORDER — METHOCARBAMOL 500 MG PO TABS
500.0000 mg | ORAL_TABLET | Freq: Once | ORAL | Status: AC
  Administered 2019-12-21: 500 mg via ORAL
  Filled 2019-12-21: qty 1

## 2019-12-21 MED ORDER — ACETAMINOPHEN 500 MG PO TABS
1000.0000 mg | ORAL_TABLET | Freq: Once | ORAL | Status: AC
  Administered 2019-12-21: 1000 mg via ORAL
  Filled 2019-12-21: qty 2
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
## 2019-12-21 NOTE — ED Notes (Signed)
[employer] spoke with pt about his concerns for girlfriends wellbeing. PD explained to pt what could be done and pt denies any further assistance from PD at this time. This RN present at bedside during this discussion.
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
## 2020-09-01 ENCOUNTER — Ambulatory Visit: Payer: BC Managed Care – PPO | Admitting: Adult Health

## 2021-02-22 ENCOUNTER — Emergency Department (HOSPITAL_COMMUNITY)
Admission: EM | Admit: 2021-02-22 | Discharge: 2021-02-22 | Disposition: A | Discharge status: Home / Self Care | Payer: BC Managed Care – PPO | Attending: Emergency Medicine | Admitting: Emergency Medicine

## 2021-02-22 DIAGNOSIS — R001 Bradycardia, unspecified: Secondary | ICD-10-CM | POA: Insufficient documentation

## 2021-02-22 DIAGNOSIS — G478 Other sleep disorders: Secondary | ICD-10-CM

## 2021-02-22 DIAGNOSIS — F410 Panic disorder [episodic paroxysmal anxiety] without agoraphobia: Secondary | ICD-10-CM | POA: Diagnosis present

## 2021-02-22 MED ORDER — HYDROXYZINE HCL 25 MG PO TABS: 25.0000 mg | ORAL_TABLET | Freq: Four times a day (QID) | ORAL | 0 refills | Status: AC | PRN

## 2021-02-22 NOTE — ED Notes (Signed)
Pt d/c home per MD order. Discharge summary reviewed with pt, pt verbalizes understanding. Voicing no complaints at discharge. Ambulatory. Discharged home with visitor.

## 2021-02-22 NOTE — Discharge Instructions (Signed)
Contact a health care provider if:  Your symptoms do not improve, or they get worse.  You are not able to take your medicine as prescribed because of side effects.  Get help right away if:  You have serious thoughts about hurting yourself or others.  You have symptoms of a panic attack. Do not drive yourself to the hospital. Have someone else drive you or call an ambulance.  If you ever feel like you may hurt yourself or others, or you have thoughts about taking your own life, get help right away. You can go to your nearest emergency department or call:  Your local emergency services (911 in the [place]  A suicide crisis helpline, such as the National Suicide Prevention Lifeline at 1-800-273-8255. This is open 24 hours a day.

## 2021-02-22 NOTE — ED Triage Notes (Addendum)
Pt to ED via EMS from home c/o anxiety. Apparently pt text g/f this morning stating "help" He was suppose to go to work , but did not. G/F went to pt home and called EMS. Apparently pt has had increase stress. Hx: anxiety/panic. A&OX4, No medications given by EMS. 124/74, RR35, 99%RA, 85CBG, Temp 97.4 ; When placed on monitor with EMS pt noted HR 40-60bpm bradycardic arhythmia. Does not take medications

## 2021-02-22 NOTE — ED Provider Notes (Signed)
[facility] EMERGENCY DEPARTMENT Provider Note   CSN: [id number] Arrival date & time: 02/22/21  0857     History Chief Complaint  Patient presents with  . Anxiety    Brandon Cortez is a 25 y.o. male who presents with a cc of panic attack. Patient reports that he awoke with an episode of sleep paralysis this morning.  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  He also has a history of sleepwalking as a child.  He states that this triggered a panic attack.  He has a history of anxiety disorder and panic and is currently not on any treatment.  He states that he was panicking badly and was shaking significantly and had texted help to his friend from work.  His mom reports that he has had anxiety and panic for a long time and she is currently being treated for the same.  He feels fine now but feels a little bit drained.  Notably he has resting heart rate in the high 40s to 60.  The patient is a current semipro football player.  HPI     Past Medical History:  Diagnosis Date  . Medical history non-contributory     Patient Active Problem List   Diagnosis Date Noted  . MDD (major depressive disorder), single episode, severe (HCC) 05/02/2018    No past surgical history on file.     No family history on file.  Social History   Tobacco Use  . Smoking status: Never Smoker  . Smokeless tobacco: Never Used  Vaping Use  . Vaping Use: Never used  Substance Use Topics  . Alcohol use: No  . Drug use: Yes    Types: Marijuana    Home Medications Prior to Admission medications   Medication Sig Start Date End Date Taking? Authorizing Provider  FLUoxetine (PROZAC) 20 MG capsule Take 1 capsule (20 mg total) by mouth daily. 05/06/18   Oneta Rack, NP  ibuprofen (ADVIL) 800 MG tablet Take 1 tablet (800 mg total) by mouth every 6 (six) hours as needed for moderate pain. 12/21/19   Gilda Crease, MD  methocarbamol (ROBAXIN) 500 MG tablet Take  1 tablet (500 mg total) by mouth every 8 (eight) hours as needed for muscle spasms. 12/21/19   Gilda Crease, MD  traZODone (DESYREL) 50 MG tablet Take 1 tablet (50 mg total) by mouth at bedtime as needed and may repeat dose one time if needed for sleep. 05/05/18   Oneta Rack, NP    Allergies    Patient has no known allergies.  Review of Systems   Review of Systems Ten systems reviewed and are negative for acute change, except as noted in the HPI.   Physical Exam Updated Vital Signs BP 131/83   Pulse (!) 48   Temp 98.4 F (36.9 C)   Resp 16   Ht 6\' 1"  (1.854 m)   Wt 106.6 kg   SpO2 95%   BMI 31.00 kg/m   Physical Exam Vitals and nursing note reviewed.  Constitutional:      General: He is not in acute distress.    Appearance: He is well-developed. He is not diaphoretic.  HENT:     Head: Normocephalic and atraumatic.  Eyes:     General: No scleral icterus.    Conjunctiva/sclera: Conjunctivae normal.  Cardiovascular:     Rate and Rhythm: Regular rhythm. Bradycardia present.     Heart sounds: Normal heart  sounds.  Pulmonary:     Effort: Pulmonary effort is normal. No respiratory distress.     Breath sounds: Normal breath sounds.  Abdominal:     Palpations: Abdomen is soft.     Tenderness: There is no abdominal tenderness.  Musculoskeletal:     Cervical back: Normal range of motion and neck supple.  Skin:    General: Skin is warm and dry.  Neurological:     Mental Status: He is alert.  Psychiatric:        Behavior: Behavior normal.     ED Results / Procedures / Treatments   Labs (all labs ordered are listed, but only abnormal results are displayed) Labs Reviewed - No data to display  EKG EKG Interpretation  Date/Time:  Tuesday Feb 22 2021 09:05:39 EDT Ventricular Rate:  53 PR Interval:  186 QRS Duration: 80 QT Interval:  425 QTC Calculation: 399 R Axis:   69 Text Interpretation: Sinus rhythm Anterior infarct, old No significant change since  last tracing Confirmed by Gwyneth Sprout ([id number]) on 02/22/2021 9:44:35 AM   Radiology No results found.  Procedures Procedures   Medications Ordered in ED Medications - No data to display  ED Course  I have reviewed the triage vital signs and the nursing notes.  Pertinent labs & imaging results that were available during my care of the patient were reviewed by me and considered in my medical decision making (see chart for details).    MDM Rules/Calculators/A&P                           patient with panic attack/ No cp/sob. Hx of same. Suspect bradycardia is due to age and fitness level. D/c with referral to Neurology for sleep disorder. Discussed OP f/u and return precautions.  rx for atarax given. Final Clinical Impression(s) / ED Diagnoses Final diagnoses:  None    Rx / DC Orders ED Discharge Orders    None       Arthor Captain, PA-C 02/22/21 1019    Gwyneth Sprout, MD 02/23/21 [phone]

## 2021-04-25 ENCOUNTER — Institutional Professional Consult (permissible substitution): Payer: Self-pay | Admitting: Neurology

## 2021-08-22 ENCOUNTER — Telehealth: Payer: Self-pay | Admitting: Neurology

## 2021-08-22 NOTE — Telephone Encounter (Signed)
Pt requested address via mychart.

## 2021-08-29 ENCOUNTER — Encounter: Payer: Self-pay | Admitting: Neurology

## 2021-08-29 ENCOUNTER — Other Ambulatory Visit: Payer: Self-pay

## 2021-08-29 ENCOUNTER — Ambulatory Visit (INDEPENDENT_AMBULATORY_CARE_PROVIDER_SITE_OTHER): Payer: BC Managed Care – PPO | Admitting: Neurology

## 2021-08-29 VITALS — BP 124/80 | HR 80 | Ht 73.0 in | Wt 246.8 lb

## 2021-08-29 DIAGNOSIS — R6889 Other general symptoms and signs: Secondary | ICD-10-CM

## 2021-08-29 DIAGNOSIS — G4719 Other hypersomnia: Secondary | ICD-10-CM

## 2021-08-29 DIAGNOSIS — E669 Obesity, unspecified: Secondary | ICD-10-CM | POA: Diagnosis not present

## 2021-08-29 DIAGNOSIS — G4710 Hypersomnia, unspecified: Secondary | ICD-10-CM | POA: Diagnosis not present

## 2021-08-29 DIAGNOSIS — R0683 Snoring: Secondary | ICD-10-CM

## 2021-08-29 DIAGNOSIS — G478 Other sleep disorders: Secondary | ICD-10-CM

## 2021-08-29 NOTE — Patient Instructions (Addendum)
Thank you for choosing [facility] for your sleep related care! It was nice to meet you today! I appreciate that you entrust me with your sleep related healthcare concerns. I hope, I was able to address at least some of your concerns today, and that I can help you feel reassured and also get better.    Here is what we discussed today and what we came up with as our plan for you:    Based on your symptoms and your exam I believe you may be at some risk for obstructive sleep apnea (OSA). However, you could also have an underlying sleepiness condition such as narcolepsy.  As discussed, we will look into your severe sleepiness with a nighttime sleep study, followed by a daytime nap study. In preparation for sleep study testing, try to work on her sleep schedule by keeping a set schedule for nighttime sleep and daytime advised.  Do not start anxiety or depression medication and do not take any narcotic pain medication.  Please stop smoking marijuana as it may interfere with your sleep study results.  Do not drive when sleepy! Please keep your sleep schedule stable and do not add any additional medications or caffeine in your day to day routine, in preparation of the study.  Our sleep lab administrative assistant will call you to schedule your sleep study. If you don't hear back from her by about 2 weeks from now, please feel free to call her at [phone]. You can leave a message with your phone number and concerns, if you get the voicemail box. She will call back as soon as possible.

## 2021-08-29 NOTE — Progress Notes (Signed)
Subjective:    Patient ID: Brandon Cortez is a 25 y.o. male.  HPI    Huston Foley, MD, PhD [facility] [address], [address] [address] [place], [place] [address]  I saw patient, Brandon Cortez, as a referral from the emergency room for evaluation of his sleep disturbance, including concern for obstructive sleep apnea and recent episode of sleep paralysis.  The patient is unaccompanied today. He missed an appointment on 04/25/21. Brandon Cortez is a 25 year old right-handed gentleman with an underlying medical history of depression, anxiety, bradycardia, and mild obesity, who daytime somnolence as well as some snoring as he has gained weight.  He reports that he plays football and when he is not playing he has a few pounds. He presented to the emergency room on 02/22/2021 with a complaint of panic attack.  He reported an episode of sleep paralysis in the morning.  He also reported a history of sleepwalking as a child.  I reviewed the emergency room records.  He was given a prescription for hydroxyzine.  His Epworth sleepiness score is 13 out of 24, fatigue severity score is 51 out of 63.  His mother may have sleep apnea, but that he is not 100% sure.  He reports that he cannot sleep more than 12 hours during the day or at night.  He cannot take a short nap, he will end up sleeping 2 to 3 hours.  He feels sleepy after a meal.  Symptoms have been ongoing for months, probably at least since the beginning of this year.  He does report vivid dreams but does not always recall his dreams later on.  He has had infrequent sleep paralysis episodes.  He does not drink caffeine on a day-to-day basis and very little alcohol on special occasions typically only.  He does smoke marijuana once a day on average.  He does not take any illicit drugs, no smoking of cigarettes reported, stopped when he was 25 years old and smoked off and on for about 3 years.  His bedtime and rise time are not set, he reports  that he is in the process of changing his jobs.  He worked at [employer] and is starting a new job at [employer].  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  He easily falls asleep when he is sedentary.  He has not fallen asleep driving he reports.  He reports that he has residual depression and anxiety and is currently not followed by a psychiatrist or primary care.  He is currently not on any prescription medications, his list of medications contained Prozac, hydroxyzine, Robaxin and trazodone, none of which he is taking currently.  He reports that he was hospitalized for depression in 2020.  Chart review shows that he was hospitalized in 2019 for depression.  He reports that he was given the option of following up as an outpatient with psychiatry but never did. He is single and lives alone, he has a 62-year-old child.  His Past Medical History Is Significant For: Past Medical History:  Diagnosis Date   Medical history non-contributory     His Past Surgical History Is Significant For: History reviewed. No pertinent surgical history.  His Family History Is Significant For: Family History  Problem Relation Age of Onset   Sleep apnea Mother     His Social History Is Significant For: Social History   Socioeconomic  History   Marital status: Single    Spouse name: Not on file   Number of children: Not on file   Years of education: Not on file   Highest education level: Not on file  Occupational History   Not on file  Tobacco Use   Smoking status: Never   Smokeless tobacco: Never  Vaping Use   Vaping Use: Some days  Substance and Sexual Activity   Alcohol use: No   Drug use: Yes    Types: Marijuana   Sexual activity: Yes  Other Topics Concern   Not on file  Social History Narrative   Not on file   Social Determinants of Health   Financial Resource Strain: Not on file  Food Insecurity: Not on file   Transportation Needs: Not on file  Physical Activity: Not on file  Stress: Not on file  Social Connections: Not on file    His Allergies Are:  No Known Allergies:   His Current Medications Are:  Outpatient Encounter Medications as of 08/29/2021  Medication Sig   ibuprofen (ADVIL) 200 MG tablet Take 200 mg by mouth as needed.   FLUoxetine (PROZAC) 20 MG capsule Take 1 capsule (20 mg total) by mouth daily.   hydrOXYzine (ATARAX/VISTARIL) 25 MG tablet Take 1 tablet (25 mg total) by mouth every 6 (six) hours as needed for anxiety.   ibuprofen (ADVIL) 800 MG tablet Take 1 tablet (800 mg total) by mouth every 6 (six) hours as needed for moderate pain.   methocarbamol (ROBAXIN) 500 MG tablet Take 1 tablet (500 mg total) by mouth every 8 (eight) hours as needed for muscle spasms.   traZODone (DESYREL) 50 MG tablet Take 1 tablet (50 mg total) by mouth at bedtime as needed and may repeat dose one time if needed for sleep.   No facility-administered encounter medications on file as of 08/29/2021.  :   Review of Systems:  Out of a complete 14 point review of systems, all are reviewed and negative with the exception of these symptoms as listed below:  Review of Systems  Neurological:        Pt is here for sleep consult. Pt states he snores and has fatigue throughtout the day. Pt denies sleep study and CPAP machine and Hypertension   FSS:51 ESS:13   Objective:  Neurological Exam  Physical Exam Physical Examination:   Vitals:   08/29/21 1345  BP: 124/80  Pulse: 80    General Examination: The patient is a very pleasant 25 y.o. male in no acute distress. He appears well-developed and well-nourished and well groomed.   HEENT: Normocephalic, atraumatic, pupils are equal, round and reactive to light, extraocular tracking is good without limitation to gaze excursion or nystagmus noted. Hearing is grossly intact. Face is symmetric with normal facial animation. Speech is clear with no  dysarthria noted. There is no hypophonia. There is no lip, neck/head, jaw or voice tremor. Neck is supple with full range of passive and active motion. There are no carotid bruits on auscultation. Oropharynx exam reveals: mild mouth dryness, good dental hygiene and mild airway crowding, due to slightly larger uvula, tonsillar size of about 1+, Mallampati class III, neck circumference 18 inches.  Tongue protrudes centrally and palate elevates symmetrically.  No significant overbite noted.  Chest: Benign exam.  Heart: Benign exam .   Abdomen: Soft, non-tender and non-distended.  Extremities: There is no pitting edema in the distal lower extremities bilaterally.   Skin: Warm and dry without trophic  changes noted.   Musculoskeletal: exam reveals no obvious joint deformities.   Neurologically:  Mental status: The patient is awake, alert and oriented in all 4 spheres. His immediate and remote memory, attention, language skills and fund of knowledge are appropriate. There is no evidence of aphasia, agnosia, apraxia or anomia. Speech is clear with normal prosody and enunciation. Thought process is linear. Mood is normal and affect is normal.  Cranial nerves II - XII are as described above under HEENT exam.  Motor exam: Normal bulk, strength and tone is noted. There is no tremor, fine motor skills and coordination: grossly intact.  Cerebellar testing: No dysmetria or intention tremor. There is no truncal or gait ataxia.  Sensory exam: intact to light touch in the upper and lower extremities.  Gait, station and balance: He stands easily. No veering to one side is noted. No leaning to one side is noted. Posture is age-appropriate and stance is narrow based. Gait shows normal stride length and normal pace. No problems turning are noted.         Assessment and plan:  In summary, Harshith S Ganaway is a very pleasant 25 y.o.-year old male with an underlying medical history of depression, anxiety, bradycardia,  and mild obesity, who presents for evaluation of his sleep disturbance including snoring, daytime somnolence, needing to sleep several hours at a time, not being able to wake up easily in the morning and having had sleep paralysis.  History and examination are not telltale for narcolepsy but there is a possibility that he has a hypersomnolence disorder underlying.  Obstructive sleep disordered breathing is also not excluded at this time.  It is complicating that he does not keep a very set schedule for his sleep, we talked about the importance of healthy lifestyle, keeping a good sleep hygiene and healthy sleep habits.  We talked about how marijuana can be sedating.  He is advised to proceed with extended sleep testing in the form of a nocturnal polysomnogram followed by a daytime nap study.  Differential diagnosis includes sleepiness for underlying sleep disordered breathing, idiopathic hypersomnolence, narcolepsy, probably without telltale cataplexy.  Untreated or undertreated depression and anxiety may also play into his symptoms.  We talked about this at length as well.  He is strongly encouraged to establish care with at least a primary care provider and/or a mental health provider.  He is going to look into this.  He is advised to keep working on a more steady sleep schedule.  In preparation for his extended sleep testing he is advised to avoid starting any new medications and stop smoking marijuana altogether.  We will call him to schedule his PSG/MSLT and also with the results.  We will make a follow-up in this clinic accordingly.  If he has obstructive sleep apnea, he is aware of the treatment possibility with a positive airway pressure device such as CPAP or AutoPap.  We will pick up our discussion after testing.  I answered all his questions today and he was in agreement with our plan.

## 2021-11-17 ENCOUNTER — Telehealth: Payer: Self-pay

## 2021-11-17 NOTE — Telephone Encounter (Signed)
LVM for pt to call me back to schedule sleep study  

## 2021-11-24 ENCOUNTER — Telehealth: Payer: Self-pay

## 2021-11-24 NOTE — Telephone Encounter (Signed)
Called pt today to schedule his sleep study however was unable to leave pt a VM due to mailbox being full.

## 2021-12-19 ENCOUNTER — Telehealth: Payer: Self-pay

## 2021-12-19 NOTE — Telephone Encounter (Signed)
We have attempted to call the patient 2 times to schedule sleep study. Patient has been unavailable at the phone numbers we have on file and has not returned our calls. If patient calls back we will schedule them for their sleep study. ° °

## 2024-08-24 ENCOUNTER — Emergency Department (HOSPITAL_COMMUNITY)

## 2024-08-24 ENCOUNTER — Emergency Department (HOSPITAL_COMMUNITY)
Admission: EM | Admit: 2024-08-24 | Discharge: 2024-08-24 | Disposition: A | Discharge status: Home / Self Care | Attending: Emergency Medicine | Admitting: Emergency Medicine

## 2024-08-24 ENCOUNTER — Other Ambulatory Visit: Payer: Self-pay

## 2024-08-24 ENCOUNTER — Encounter (HOSPITAL_COMMUNITY): Payer: Self-pay

## 2024-08-24 DIAGNOSIS — Z23 Encounter for immunization: Secondary | ICD-10-CM | POA: Diagnosis not present

## 2024-08-24 DIAGNOSIS — M25572 Pain in left ankle and joints of left foot: Secondary | ICD-10-CM | POA: Diagnosis not present

## 2024-08-24 DIAGNOSIS — S61411A Laceration without foreign body of right hand, initial encounter: Secondary | ICD-10-CM | POA: Diagnosis present

## 2024-08-24 DIAGNOSIS — S61210A Laceration without foreign body of right index finger without damage to nail, initial encounter: Secondary | ICD-10-CM | POA: Insufficient documentation

## 2024-08-24 DIAGNOSIS — S61214A Laceration without foreign body of right ring finger without damage to nail, initial encounter: Secondary | ICD-10-CM | POA: Insufficient documentation

## 2024-08-24 DIAGNOSIS — W25XXXA Contact with sharp glass, initial encounter: Secondary | ICD-10-CM | POA: Diagnosis not present

## 2024-08-24 DIAGNOSIS — S61216A Laceration without foreign body of right little finger without damage to nail, initial encounter: Secondary | ICD-10-CM | POA: Insufficient documentation

## 2024-08-24 DIAGNOSIS — S61011A Laceration without foreign body of right thumb without damage to nail, initial encounter: Secondary | ICD-10-CM | POA: Diagnosis not present

## 2024-08-24 MED ORDER — ONDANSETRON HCL 4 MG/2ML IJ SOLN
INTRAMUSCULAR | Status: AC
  Administered 2024-08-24: 4 mg via INTRAVENOUS
  Filled 2024-08-24: qty 2

## 2024-08-24 MED ORDER — CEPHALEXIN 500 MG PO CAPS: 500.0000 mg | ORAL_CAPSULE | Freq: Four times a day (QID) | ORAL | 0 refills | Status: DC

## 2024-08-24 MED ORDER — LIDOCAINE HCL (PF) 1 % IJ SOLN
30.0000 mL | Freq: Once | INTRAMUSCULAR | Status: AC
  Administered 2024-08-24: 30 mL
  Filled 2024-08-24: qty 30

## 2024-08-24 MED ORDER — TETANUS-DIPHTH-ACELL PERTUSSIS 5-2-15.5 LF-MCG/0.5 IM SUSP
0.5000 mL | Freq: Once | INTRAMUSCULAR | Status: AC
  Administered 2024-08-24: 0.5 mL via INTRAMUSCULAR
  Filled 2024-08-24: qty 0.5

## 2024-08-24 MED ORDER — LORAZEPAM 2 MG/ML IJ SOLN
1.0000 mg | Freq: Once | INTRAMUSCULAR | Status: AC
  Administered 2024-08-24: 1 mg via INTRAVENOUS
  Filled 2024-08-24: qty 1

## 2024-08-24 MED ORDER — ONDANSETRON HCL 4 MG/2ML IJ SOLN: 4.0000 mg | Freq: Once | INTRAMUSCULAR | Status: DC

## 2024-08-24 MED ORDER — ONDANSETRON HCL 4 MG/2ML IJ SOLN: 4.0000 mg | Freq: Once | INTRAMUSCULAR | Status: AC

## 2024-08-24 NOTE — ED Provider Notes (Addendum)
 [place] EMERGENCY DEPARTMENT AT [facility] Provider Note   CSN: [id number] Arrival date & time: 08/24/24  9165     Patient presents with: Extremity Laceration   Brandon Cortez is a 28 y.o. male.   28 year old male with prior medical history as detailed below presents for evaluation.  Patient was upset this morning and punched a mirror.  He suffered several lacerations to his right hand.  He also complains of mild pain to the left ankle after a football game yesterday.  He is unsure of his last tetanus.  The history is provided by the patient and medical records.       Prior to Admission medications   Medication Sig Start Date End Date Taking? Authorizing Provider  FLUoxetine  (PROZAC ) 20 MG capsule Take 1 capsule (20 mg total) by mouth daily. 05/06/18   Ezzard Staci SAILOR, NP  hydrOXYzine  (ATARAX /VISTARIL ) 25 MG tablet Take 1 tablet (25 mg total) by mouth every 6 (six) hours as needed for anxiety. 02/22/21   Harris, Abigail, PA-C  ibuprofen  (ADVIL ) 200 MG tablet Take 200 mg by mouth as needed.    [provider]  ibuprofen  (ADVIL ) 800 MG tablet Take 1 tablet (800 mg total) by mouth every 6 (six) hours as needed for moderate pain. 12/21/19   Haze Lonni PARAS, MD  methocarbamol  (ROBAXIN ) 500 MG tablet Take 1 tablet (500 mg total) by mouth every 8 (eight) hours as needed for muscle spasms. 12/21/19   Haze Lonni PARAS, MD  traZODone  (DESYREL ) 50 MG tablet Take 1 tablet (50 mg total) by mouth at bedtime as needed and may repeat dose one time if needed for sleep. 05/05/18   Ezzard Staci SAILOR, NP    Allergies: Patient has no known allergies.    Review of Systems  All other systems reviewed and are negative.   Updated Vital Signs BP 127/83   Pulse 64   Temp 97.9 F (36.6 C) (Oral)   Resp 17   Ht 6' 1 (1.854 m)   Wt 115.7 kg   SpO2 99%   BMI 33.64 kg/m   Physical Exam Vitals and nursing note reviewed.  Constitutional:      General: He is not in  acute distress.    Appearance: Normal appearance. He is well-developed.  HENT:     Head: Normocephalic and atraumatic.  Eyes:     Conjunctiva/sclera: Conjunctivae normal.     Pupils: Pupils are equal, round, and reactive to light.  Cardiovascular:     Rate and Rhythm: Normal rate and regular rhythm.     Heart sounds: Normal heart sounds.  Pulmonary:     Effort: Pulmonary effort is normal. No respiratory distress.     Breath sounds: Normal breath sounds.  Abdominal:     General: There is no distension.     Palpations: Abdomen is soft.     Tenderness: There is no abdominal tenderness.  Musculoskeletal:        General: No deformity. Normal range of motion.     Cervical back: Normal range of motion and neck supple.  Skin:    General: Skin is warm and dry.     Comments: Multiple lacerations to the right hand primarily in the thumb and index finger.   Neurological:     General: No focal deficit present.     Mental Status: He is alert and oriented to person, place, and time.     (all labs ordered are listed, but only abnormal results are  displayed) Labs Reviewed - No data to display  EKG: None  Radiology: DG Ankle Complete Left Result Date: 08/24/2024 CLINICAL DATA:  Ankle pain EXAM: LEFT ANKLE COMPLETE - 3+ VIEW COMPARISON:  None Available. FINDINGS: No acute fracture or dislocation. Joint spaces and alignment are maintained. No area of erosion or osseous destruction. No unexpected radiopaque foreign body. Soft tissues are unremarkable. IMPRESSION: No acute fracture or dislocation. Electronically Signed   By: Corean Salter M.D.   On: 08/24/2024 09:33   DG Hand Complete Right Result Date: 08/24/2024 CLINICAL DATA:  punched mirror with subsequent laceration EXAM: RIGHT HAND - COMPLETE 3+ VIEW; RIGHT WRIST - COMPLETE 3+ VIEW COMPARISON:  May 05, 2015 FINDINGS: Evaluation is limited by overlapping bandage. There is some fragmented appearing high density material overlapping the  radial aspect of the first proximal phalanx subcutaneous tissues. This measures approximately 16 mm. No acute fracture or dislocation. Joint spaces and alignment are maintained. IMPRESSION: There is some fragmented appearing high density material overlapping the radial aspect of the first proximal phalanx subcutaneous tissues. This may reflect retained foreign body versus bandage material. Recommend correlation with physical exam. If there is a persistent clinical concern for scaphoid fracture, recommend immobilization and follow-up radiographs in 2 weeks versus MRI. Electronically Signed   By: Corean Salter M.D.   On: 08/24/2024 09:32   DG Wrist Complete Right Result Date: 08/24/2024 CLINICAL DATA:  punched mirror with subsequent laceration EXAM: RIGHT HAND - COMPLETE 3+ VIEW; RIGHT WRIST - COMPLETE 3+ VIEW COMPARISON:  May 05, 2015 FINDINGS: Evaluation is limited by overlapping bandage. There is some fragmented appearing high density material overlapping the radial aspect of the first proximal phalanx subcutaneous tissues. This measures approximately 16 mm. No acute fracture or dislocation. Joint spaces and alignment are maintained. IMPRESSION: There is some fragmented appearing high density material overlapping the radial aspect of the first proximal phalanx subcutaneous tissues. This may reflect retained foreign body versus bandage material. Recommend correlation with physical exam. If there is a persistent clinical concern for scaphoid fracture, recommend immobilization and follow-up radiographs in 2 weeks versus MRI. Electronically Signed   By: Corean Salter M.D.   On: 08/24/2024 09:32     Procedures   Medications Ordered in the ED  ondansetron  (ZOFRAN ) injection 4 mg (4 mg Intravenous Given 08/24/24 0851)  Tdap (ADACEL ) injection 0.5 mL (0.5 mLs Intramuscular Given 08/24/24 0904)  LORazepam  (ATIVAN ) injection 1 mg (1 mg Intravenous Given 08/24/24 1000)  lidocaine  (PF) (XYLOCAINE ) 1 %  injection 30 mL (30 mLs Infiltration Given 08/24/24 1000)                                    Medical Decision Making Patient with a laceration of the right hand after he became upset and punched a mirror.  Laceration is closed without difficulty by midlevel provider.  Imaging of the hand and ankle are without clear fracture identified.  After laceration repair the patient is comfortable.    Close follow-up is stressed.  Strict return precautions given and understood.  Amount and/or Complexity of Data Reviewed Radiology: ordered.  Risk Prescription drug management.        Final diagnoses:  Laceration of right hand without foreign body, initial encounter    ED Discharge Orders          Ordered    cephALEXin  (KEFLEX ) 500 MG capsule  4 times daily  08/24/24 1137               Laurice Maude BROCKS, MD 08/24/24 1159    Laurice Maude BROCKS, MD 08/24/24 1159

## 2024-08-24 NOTE — ED Provider Notes (Signed)
.  Laceration Repair  Date/Time: 08/24/2024 11:28 AM  Performed by: Neysa Thersia RAMAN, PA-C Authorized by: Neysa Thersia RAMAN, PA-C   Consent:    Consent obtained:  Verbal   Consent given by:  Patient   Risks discussed:  Infection Universal protocol:    Patient identity confirmed:  Verbally with patient Anesthesia:    Anesthesia method:  Local infiltration and nerve block   Local anesthetic:  Lidocaine  1% w/o epi   Block location:  1,2 digit   Block needle gauge:  25 G   Block anesthetic:  Lidocaine  1% w/o epi   Block technique:  Digital   Block injection procedure:  Anatomic landmarks identified and anatomic landmarks palpated   Block outcome:  Anesthesia achieved Laceration details:    Location:  Finger   Finger location:  R thumb (Right thumb, index finger, 4th, and 5th finger)   Length (cm):  4 (1, 1, 2 respectively)   Depth (mm):  1 Exploration:    Hemostasis achieved with:  Direct pressure and tied off vessels Treatment:    Area cleansed with:  Saline (peroxide)   Amount of cleaning:  Extensive   Irrigation solution:  Sterile saline   Irrigation volume:  250cc   Irrigation method:  Pressure wash   Debridement:  Minimal   Undermining:  None Skin repair:    Repair method:  Sutures   Suture size:  4-0   Suture material:  Prolene   Suture technique:  Simple interrupted   Number of sutures:  14 Approximation:    Approximation:  Close Repair type:    Repair type:  Simple Post-procedure details:    Dressing:  Sterile dressing   Procedure completion:  Tolerated well, no immediate complications     Neysa Thersia RAMAN, PA-C 08/24/24 1139    Laurice Maude BROCKS, MD 08/24/24 [phone]

## 2024-08-24 NOTE — ED Triage Notes (Signed)
 Pt got mad and punched a mirror this morning and has a laceration to right hand. [employer] applied tourniquet at 0750. Axox4. C/O left ankle pain from playing basketball yesterday. EMS gave 50 mcgs of fentanyl . Denies SI and HI.

## 2024-08-24 NOTE — Discharge Instructions (Addendum)
 Return for any problem.  Follow-up with Dr. Alm Hummer (hand specialist).  Sutures need to be removed in 7 to 10 days.   Take all of prescribed antibiotics.

## 2024-08-24 NOTE — ED Notes (Signed)
 Tourniquet removed at this time by MD [facility]

## 2024-08-27 ENCOUNTER — Other Ambulatory Visit: Payer: Self-pay

## 2024-08-27 ENCOUNTER — Emergency Department (HOSPITAL_COMMUNITY): Admission: EM | Admit: 2024-08-27 | Discharge: 2024-08-27 | Disposition: A | Discharge status: Home / Self Care

## 2024-08-27 ENCOUNTER — Emergency Department (HOSPITAL_COMMUNITY)

## 2024-08-27 DIAGNOSIS — S61421D Laceration with foreign body of right hand, subsequent encounter: Secondary | ICD-10-CM

## 2024-08-27 DIAGNOSIS — M25572 Pain in left ankle and joints of left foot: Secondary | ICD-10-CM | POA: Insufficient documentation

## 2024-08-27 DIAGNOSIS — R110 Nausea: Secondary | ICD-10-CM | POA: Diagnosis not present

## 2024-08-27 DIAGNOSIS — M79641 Pain in right hand: Secondary | ICD-10-CM | POA: Insufficient documentation

## 2024-08-27 MED ORDER — CLINDAMYCIN HCL 150 MG PO CAPS: 150.0000 mg | ORAL_CAPSULE | Freq: Four times a day (QID) | ORAL | 0 refills | Status: AC

## 2024-08-27 MED ORDER — OXYCODONE-ACETAMINOPHEN 5-325 MG PO TABS
1.0000 | ORAL_TABLET | Freq: Once | ORAL | Status: AC
  Administered 2024-08-27: 1 via ORAL
  Filled 2024-08-27: qty 1

## 2024-08-27 MED ORDER — ONDANSETRON 4 MG PO TBDP
4.0000 mg | ORAL_TABLET | Freq: Once | ORAL | Status: AC
  Administered 2024-08-27: 4 mg via ORAL
  Filled 2024-08-27: qty 1

## 2024-08-27 MED ORDER — OXYCODONE-ACETAMINOPHEN 5-325 MG PO TABS: 1.0000 | ORAL_TABLET | Freq: Four times a day (QID) | ORAL | 0 refills | Status: AC | PRN

## 2024-08-27 MED ORDER — ONDANSETRON 4 MG PO TBDP: 4.0000 mg | ORAL_TABLET | Freq: Three times a day (TID) | ORAL | 0 refills | Status: AC | PRN

## 2024-08-27 NOTE — ED Triage Notes (Addendum)
 During triage, pt would not speak. Pts girlfriend provided all triage information.   PT arrives to triage with complaints of RIGHT hand pain following a hand laceration on 11/23. Pt also concerned of LEFT ankle pain. PT reports changing the hand dressing once since leaving. Pt has not been taking ABX as ordered due to nausea. Pt has not contacted hand specialist for follow, as directed on previous AVS.

## 2024-08-27 NOTE — Discharge Instructions (Addendum)
 Continue to change bandages.  Please follow-up with the hand surgeon as soon as possible.  We are changing your antibiotic and giving you prescription for pain medications.  May take Tylenol  starting with ibuprofen  for baseline pain control.  Narcotics as needed for breakthrough pain.  Return for fevers, chills, severe pain, wounds become red, hot, swollen, draining pus, foul odor or he develop any new or worsening symptoms that are concerning to you.  You may wear the cam boot for comfort and walk as tolerated.

## 2024-08-27 NOTE — ED Provider Notes (Signed)
 [place] EMERGENCY DEPARTMENT AT [facility] Provider Note   CSN: [id number] Arrival date & time: 08/27/24  9181     Patient presents with: Hand Pain   Brandon Cortez is a 28 y.o. male.   28 year old male presenting emergency department with right hand pain and left ankle pain.  Punched a mirror on 11/23 had lacerations repaired in the ED.  Reports he continues to have pain to the hand.  No fevers or chills.  Was prescribed antibiotic but has only taken a few doses as it does make him nauseated.  Reports continued pain to his left ankle.  No numbness tingling changes in sensation.   Hand Pain       Prior to Admission medications   Medication Sig Start Date End Date Taking? Authorizing Provider  cephALEXin  (KEFLEX ) 500 MG capsule Take 1 capsule (500 mg total) by mouth 4 (four) times daily. 08/24/24   Laurice Maude BROCKS, MD  FLUoxetine  (PROZAC ) 20 MG capsule Take 1 capsule (20 mg total) by mouth daily. 05/06/18   Ezzard Staci SAILOR, NP  hydrOXYzine  (ATARAX /VISTARIL ) 25 MG tablet Take 1 tablet (25 mg total) by mouth every 6 (six) hours as needed for anxiety. 02/22/21   Harris, Abigail, PA-C  ibuprofen  (ADVIL ) 200 MG tablet Take 200 mg by mouth as needed.    [provider]  ibuprofen  (ADVIL ) 800 MG tablet Take 1 tablet (800 mg total) by mouth every 6 (six) hours as needed for moderate pain. 12/21/19   Haze Lonni PARAS, MD  methocarbamol  (ROBAXIN ) 500 MG tablet Take 1 tablet (500 mg total) by mouth every 8 (eight) hours as needed for muscle spasms. 12/21/19   Haze Lonni PARAS, MD  traZODone  (DESYREL ) 50 MG tablet Take 1 tablet (50 mg total) by mouth at bedtime as needed and may repeat dose one time if needed for sleep. 05/05/18   Ezzard Staci SAILOR, NP    Allergies: Patient has no known allergies.    Review of Systems  Updated Vital Signs BP (!) 134/94 (BP Location: Left Arm)   Pulse 89   Temp 97.8 F (36.6 C) (Oral)   Resp 20   SpO2 99%   Physical  Exam Vitals and nursing note reviewed.  Constitutional:      General: He is not in acute distress.    Appearance: He is not toxic-appearing or diaphoretic.  HENT:     Head: Normocephalic.     Nose: Nose normal.     Mouth/Throat:     Mouth: Mucous membranes are moist.  Eyes:     Conjunctiva/sclera: Conjunctivae normal.  Cardiovascular:     Rate and Rhythm: Normal rate and regular rhythm.  Pulmonary:     Effort: Pulmonary effort is normal.  Abdominal:     General: Abdomen is flat. There is no distension.     Tenderness: There is no abdominal tenderness. There is no guarding or rebound.  Musculoskeletal:     Comments: Lacerations to the thumb and fingers well-approximated.  No erythema warmth induration or fluctuance.  Brisk cap refill.  Does have what appears to be some global edema around wounds in the hand.  Left ankle with tenderness to the lateral malleolus.  Joint stable.  No erythema or induration or fluctuance.  No crepitus  Neurological:     Mental Status: He is alert.     (all labs ordered are listed, but only abnormal results are displayed) Labs Reviewed - No data to display  EKG: None  Radiology: DG Ankle 2 Views Left Result Date: 08/27/2024 EXAM: 2 VIEW(S) XRAY OF THE LEFT ANKLE 08/27/2024 09:22:00 AM CLINICAL HISTORY: 28 year old male with thumb pain and recent lac repairs, left ankle pain . COMPARISON: 08/24/2024 FINDINGS: BONES AND JOINTS: No acute fracture. No focal osseous lesion. No joint dislocation. SOFT TISSUES: The soft tissues are unremarkable. IMPRESSION: 1. No acute osseous abnormality identified about the left ankle. Electronically signed by: Helayne Hurst MD 08/27/2024 09:45 AM EST RP Workstation: [id number]   DG Hand 2 View Right Result Date: 08/27/2024 EXAM: 1 VIEW(S) XRAY OF THE RIGHT HAND 08/27/2024 09:22:00 AM COMPARISON: Right hand series 08/24/2024. CLINICAL HISTORY: 28 year old M. Thumb pain; recent lac repairs. FINDINGS: BONES AND JOINTS: No  acute fracture. No focal osseous lesion. No joint dislocation. SOFT TISSUES: Focal soft tissue swelling with punctate radiopaque foreign bodies at the medial proximal phalanx of the thumb, as seen on the comparison study. IMPRESSION: 1. Focal soft tissue swelling with punctate radiopaque foreign bodies at the medial proximal phalanx of the thumb. Electronically signed by: Helayne Hurst MD 08/27/2024 09:44 AM EST RP Workstation: [id number]     Procedures   Medications Ordered in the ED  oxyCODONE -acetaminophen  (PERCOCET/ROXICET) 5-325 MG per tablet 1 tablet (1 tablet Oral Given 08/27/24 0920)  ondansetron  (ZOFRAN -ODT) disintegrating tablet 4 mg (4 mg Oral Given 08/27/24 0907)                                   Medical Decision Making 28 year old male presenting emergency department with left hand pain and ankle pain.  Evaluated for the same on 11/23.  Had lacerations that were sutured.  Per chart review did not appear to be foreign bodies or underlying fractures to the hand or to foot.  Patient is requesting repeat imaging.  Ordered.  Will also give pain medications.  He has follow-up with a hand surgeon in a few days.  His wounds do not appear to be infected.  Low suspicion for flexor tenosynovitis or deep space infection.  Repeat x-rays without fracture.  Did show suspicion for retained foreign body.  Discussed close follow-up with hand surgeon as well as changing antibiotic.  Patient reports difficulty ambulating secondary to his ankle sprain.  His x-ray was negative.  Will place him boot for comfort.  Follow-up with Ortho as needed.  Will give pain medications.  Strict return precautions given.  Amount and/or Complexity of Data Reviewed Radiology: ordered.  Risk Prescription drug management.       Final diagnoses:  None    ED Discharge Orders     None          Neysa Caron PARAS, DO 08/27/24 1022

## 2024-09-01 ENCOUNTER — Emergency Department (HOSPITAL_COMMUNITY)
Admission: EM | Admit: 2024-09-01 | Discharge: 2024-09-01 | Disposition: A | Discharge status: Home / Self Care | Attending: Emergency Medicine | Admitting: Emergency Medicine

## 2024-09-01 ENCOUNTER — Encounter (HOSPITAL_COMMUNITY): Payer: Self-pay | Admitting: Emergency Medicine

## 2024-09-01 ENCOUNTER — Other Ambulatory Visit: Payer: Self-pay

## 2024-09-01 DIAGNOSIS — Z4802 Encounter for removal of sutures: Secondary | ICD-10-CM | POA: Insufficient documentation

## 2024-09-01 MED ORDER — BACITRACIN ZINC 500 UNIT/GM EX OINT
TOPICAL_OINTMENT | Freq: Once | CUTANEOUS | Status: AC
  Administered 2024-09-01: 1 via TOPICAL
  Filled 2024-09-01: qty 0.9

## 2024-09-01 NOTE — Discharge Instructions (Signed)
 Follow-up with the hand surgeon at your scheduled appointment.  Gently wash area soap and water, apply antibiotic to the area 2 times per day.  Return to the emergency room for worsening pain, or signs of infection.

## 2024-09-01 NOTE — ED Triage Notes (Signed)
 Patient presents with desire for suture removal on his right hand.

## 2024-09-01 NOTE — ED Provider Notes (Signed)
[place] EMERGENCY DEPARTMENT AT [facility] Provider Note   CSN: [id number] Arrival date & time: 09/01/24  1308     Patient presents with: Suture / Staple Removal   Brandon Cortez is a 28 y.o. male.   28 year old male here today for suture removal.   Suture / Staple Removal       Prior to Admission medications   Medication Sig Start Date End Date Taking? Authorizing Provider  clindamycin  (CLEOCIN ) 150 MG capsule Take 1 capsule (150 mg total) by mouth every 6 (six) hours. 08/27/24   Neysa Caron PARAS, DO  FLUoxetine  (PROZAC ) 20 MG capsule Take 1 capsule (20 mg total) by mouth daily. 05/06/18   Ezzard Staci SAILOR, NP  hydrOXYzine  (ATARAX /VISTARIL ) 25 MG tablet Take 1 tablet (25 mg total) by mouth every 6 (six) hours as needed for anxiety. 02/22/21   Harris, Abigail, PA-C  ibuprofen  (ADVIL ) 200 MG tablet Take 200 mg by mouth as needed.    [provider]  ibuprofen  (ADVIL ) 800 MG tablet Take 1 tablet (800 mg total) by mouth every 6 (six) hours as needed for moderate pain. 12/21/19   Haze Lonni PARAS, MD  methocarbamol  (ROBAXIN ) 500 MG tablet Take 1 tablet (500 mg total) by mouth every 8 (eight) hours as needed for muscle spasms. 12/21/19   Haze Lonni PARAS, MD  ondansetron  (ZOFRAN -ODT) 4 MG disintegrating tablet Take 1 tablet (4 mg total) by mouth every 8 (eight) hours as needed for nausea or vomiting. 08/27/24   Neysa Caron PARAS, DO  oxyCODONE -acetaminophen  (PERCOCET/ROXICET) 5-325 MG tablet Take 1 tablet by mouth every 6 (six) hours as needed for severe pain (pain score 7-10). 08/27/24   Neysa Caron PARAS, DO  traZODone  (DESYREL ) 50 MG tablet Take 1 tablet (50 mg total) by mouth at bedtime as needed and may repeat dose one time if needed for sleep. 05/05/18   Ezzard Staci SAILOR, NP    Allergies: Patient has no known allergies.    Review of Systems  Updated Vital Signs BP (!) 152/92 (BP Location: Left Arm)   Pulse 84   Temp 98 F (36.7 C) (Oral)   Resp 18    SpO2 100%   Physical Exam Musculoskeletal:     Comments: Intact FDS, FDP function  Skin:    Comments: Well-healed lacerations to the hand.  Neurological:     Comments: Intact median, radial, ulnar nerve sensation and function     (all labs ordered are listed, but only abnormal results are displayed) Labs Reviewed - No data to display  EKG: None  Radiology: No results found.   Procedures   Medications Ordered in the ED  bacitracin ointment (has no administration in time range)                                    Medical Decision Making 28 year old male here today for suture removal.  Plan-no evidence of infection, intact sensation and function.  Provide him with some antibiotic ointment for wound areas.  He is following up with hand surgery for question of some retained glass in the hand.  Very mild wound dehiscence on the thumb, but otherwise well-healed and well-approximated.        Final diagnoses:  Visit for suture removal    ED Discharge Orders     None          Mannie Fairy DASEN, DO 09/01/24  1334
